# Patient Record
Sex: Male | Born: 1945 | Race: White | Hispanic: No | Marital: Married
Health system: Southern US, Community
[De-identification: ages and names within clinical notes are randomized; demographics above are authoritative.]

---

## 2005-07-19 ENCOUNTER — Ambulatory Visit: Payer: Self-pay | Admitting: Internal Medicine

## 2010-07-08 ENCOUNTER — Ambulatory Visit: Payer: Self-pay | Admitting: Internal Medicine

## 2010-08-22 ENCOUNTER — Ambulatory Visit: Payer: Self-pay | Admitting: Vascular Surgery

## 2011-08-28 ENCOUNTER — Ambulatory Visit (HOSPITAL_COMMUNITY)
Admission: RE | Admit: 2011-08-28 | Discharge: 2011-08-28 | Disposition: A | Discharge status: Home / Self Care | Payer: Medicare Other | Source: Ambulatory Visit | Attending: Cardiology | Admitting: Cardiology

## 2011-08-28 DIAGNOSIS — I70229 Atherosclerosis of native arteries of extremities with rest pain, unspecified extremity: Secondary | ICD-10-CM | POA: Insufficient documentation

## 2011-08-28 LAB — GLUCOSE, CAPILLARY: Glucose-Capillary: 141 mg/dL — ABNORMAL HIGH (ref 70–99)

## 2011-08-30 LAB — POCT ACTIVATED CLOTTING TIME: Activated Clotting Time: 248 seconds

## 2011-09-11 NOTE — Procedures (Signed)
NAME:  Alan Reed, Alan Reed NO.:  [id number]  MEDICAL RECORD NO.:  [id number]  LOCATION:  [facility]SC                         FACILITY:  [facility]  PHYSICIAN:  Pamella Pert, MD DATE OF BIRTH:  12-20-1945  DATE OF PROCEDURE:  08/28/2011 DATE OF DISCHARGE:                   PERIPHERAL VASCULAR INVASIVE PROCEDURE   PROCEDURE PERFORMED: 1. Abdominal aortogram. 2. Abdominal aortogram with bifemoral runoff. 3. Crossover from the right femoral artery to the left femoral artery     and left femoral arteriogram. 4. Placement of a catheter tip into the left popliteal artery and     popliteal arteriogram 5. Right femoral arteriogram with close right femoral artery access     with Perclose.  INDICATIONS:  Mr. Alan Reed is a 65 year old gentleman with history of known peripheral arterial disease, history of iliac artery stenting in the remote past, hyperlipidemia, diabetes who was referred to me for evaluation of critical limb ischemia.  He had absent left lower extremity Dopplers and he had bluish discoloration and rest pain.  He was hence brought back to peripheral angiography suite to evaluate his peripheral anatomy.  Abdominal aortogram:  Abdominal aortogram revealed presence of two renal arteries on either side.  They are widely patent.  The distal abdominal aorta showed mild amount of diffuse atherosclerotic plaque without any significant stenosis.  The aortoiliac bifurcation was widely patent.  The right iliac arteriogram.  The right iliac artery stent is widely patent.  Left iliac artery with femoral runoff.  Left iliac artery with femoral runoff revealed significant plaque burden to the left common iliac artery.  There is also mild-to-moderate amount of calcification.  There was no high-grade stenosis noted in the left common iliac artery.  The stenosis appeared to be at most 40% to 50% with a 20-25 mm pressure gradient.  This was with the crossover catheter as  retrograde access was not obtained.  Left femoral artery with distal runoff revealed left superficial femoral artery to be flush occluded.  It reconstitutes just outside of the Hunter's canal.  Below the left knee, there is one-vessel runoff in the form of peroneal artery.  The anterior tibial and posterior tibial artery are occluded. They reconstitute at the level of the ankle by means of collaterals.  Right femoral artery with distal runoff.  Right distal superficial femoral artery showed 80% stenosis.  It is a short segment stenosis.  Below the right knee, there is three-vessel runoff.  The peroneal artery does teeter off at the level of the ankle.  It is diffusely diseased distally.  However, brisk flow is evident through the posterior tibial and anterior tibial vessels.  INTERVENTION DATA:  I was able to cross the flush occluded right superficial femoral artery.  I was able to cross the flush occlusal of the left superficial femoral artery.  I did place the catheter tip into the popliteal artery and popliteal arteriogram was performed to confirm the reentry into the vessel lumen.  However, I decided to abandon stenting and angioplasty of the superficial femoral artery as at the inflow where it was flush occluded at the origin of the profunda femoral artery I was not sure that I was subintimal or I was in the true lumen. Hence, this  would clearly jeopardize the stenting process if I was not in the true lumen.  Hence, we decided to abandon the procedure as it would make it extremely complex.  Consideration can be given for repeat angiography with eye towards intravascular ultrasound interrogation to confirm the true lumen and then proceed with stenting for continued risk modification and surgical options to be evaluated.  Compared to his examination that was done on August 22, 2011, where his left foot was bluish in color and was cold, this morning his foot is actually  warm, has a pinkish appearance.  Symptomatically, he has also been feeling better.  We were able to Doppler his posterior tibial.  I will discuss these options with Dr. Earnestine Leys and make further recommendations.  A total of 133 mL of contrast was utilized for diagnostic angiography.  TECHNIQUE OF PROCEDURE:  Under sterile precautions using a 5-French right femoral artery access, a pigtail catheter was brought into the abdominal aorta and abdominal aortogram was performed.  After performing abdominal aortogram, we utilized a crossover 5-French catheter to crossover from the right femoral artery to the left femoral artery. Left femoral arteriogram with distal runoff was performed.  TECHNIQUE OF ATTEMPTED INTERVENTION:  I utilized a glide wire and a 4- French end-hole catheter.  With careful probing of the flush occlusion of right superficial femoral artery under fluoroscopic guidance, I was able to enter the lumen and prolapse the wire and I was able to enter into the true lumen into the left popliteal artery.  Then with great amount of difficulty, I tried to advance the 4-French end-hole catheter, but because of inability to perform this, I had to exchange the wire to a woolly wire.  A long extension was utilized for the woolly wire and over this woolly wire, I was able to cross the stenosis with a 4-French end-hole catheter.  The end-hole catheter was placed into the popliteal artery and popliteal arteriogram was performed to confirm the intraluminal nature.  Casual evaluation was then performed utilizing a 5-French sheath that was exchanged from 5-French introducer that was initially attempted in the right femoral artery.  I used a long 5-French sheath and crossed over from right to the left femoral artery and femoral arteriogram was performed.  I was not very convinced that I may be truly in the intraluminal region in the proximal segment at the entry site.  Hence, I decided to pull  the wire back and do conservative therapy only.  Careful pullback was performed across the left iliac artery to see if there is any significant stenosis.  There did not appear to be significant stenosis, although there was a 20-25 mm pressure gradient, I suspected this was due to scraping of the wall by the sheath that was introduced in the antegrade fashion.  Hence I left this lesion alone as it is a very large vessel.  Right femoral arteriogram was performed through the arterial access sheath and the access was closed with a 6-French Perclose with excellent hemostasis.  The patient tolerated the procedure well.  No immediate complications.     Pamella Pert, MD     JRG/MEDQ  D:  08/28/2011  T:  08/28/2011  Job:  [id number]  cc:   Tora Kindred, MD  Electronically Signed by Yates Decamp MD on 09/11/2011 08:42:26 AM

## 2011-09-18 ENCOUNTER — Ambulatory Visit (HOSPITAL_COMMUNITY)
Admission: RE | Admit: 2011-09-18 | Discharge: 2011-09-19 | Disposition: A | Discharge status: Home / Self Care | Payer: Medicare Other | Source: Ambulatory Visit | Attending: Cardiology | Admitting: Cardiology

## 2011-09-18 DIAGNOSIS — F17200 Nicotine dependence, unspecified, uncomplicated: Secondary | ICD-10-CM | POA: Insufficient documentation

## 2011-09-18 DIAGNOSIS — E785 Hyperlipidemia, unspecified: Secondary | ICD-10-CM | POA: Insufficient documentation

## 2011-09-18 DIAGNOSIS — I70219 Atherosclerosis of native arteries of extremities with intermittent claudication, unspecified extremity: Secondary | ICD-10-CM | POA: Insufficient documentation

## 2011-09-18 DIAGNOSIS — E119 Type 2 diabetes mellitus without complications: Secondary | ICD-10-CM | POA: Insufficient documentation

## 2011-09-18 DIAGNOSIS — I70229 Atherosclerosis of native arteries of extremities with rest pain, unspecified extremity: Secondary | ICD-10-CM | POA: Insufficient documentation

## 2011-09-18 LAB — GLUCOSE, CAPILLARY
Glucose-Capillary: 164 mg/dL — ABNORMAL HIGH (ref 70–99)
Glucose-Capillary: 340 mg/dL — ABNORMAL HIGH (ref 70–99)

## 2011-09-18 LAB — CBC
Hemoglobin: 17 g/dL (ref 13.0–17.0)
MCH: 32.8 pg (ref 26.0–34.0)
MCHC: 36.5 g/dL — ABNORMAL HIGH (ref 30.0–36.0)
MCV: 90 fL (ref 78.0–100.0)

## 2011-09-19 LAB — CBC
HCT: 46.8 % (ref 39.0–52.0)
Hemoglobin: 17 g/dL (ref 13.0–17.0)
MCV: 88.8 fL (ref 78.0–100.0)
RDW: 13.2 % (ref 11.5–15.5)
WBC: 16.4 10*3/uL — ABNORMAL HIGH (ref 4.0–10.5)

## 2011-09-19 LAB — BASIC METABOLIC PANEL
BUN: 17 mg/dL (ref 6–23)
CO2: 25 mEq/L (ref 19–32)
Chloride: 98 mEq/L (ref 96–112)
Creatinine, Ser: 0.76 mg/dL (ref 0.50–1.35)
GFR calc Af Amer: >90 mL/min (ref >90)
Glucose, Bld: 162 mg/dL — ABNORMAL HIGH (ref 70–99)
Potassium: 3.9 mEq/L (ref 3.5–5.1)

## 2011-09-29 NOTE — Discharge Summary (Signed)
NAMELARNELL, [name] NO.:  [id number]  MEDICAL RECORD NO.:  [id number]  LOCATION:  2504                         FACILITY:  [facility]  PHYSICIAN:  Pamella Pert, MD DATE OF BIRTH:  Nov 30, 1946  DATE OF ADMISSION:  09/18/2011 DATE OF DISCHARGE:  09/19/2011                              DISCHARGE SUMMARY   DISCHARGE DIAGNOSES: 1. Peripheral arterial disease with lifestyle limiting claudication     and ulceration and limb threatening ischemia of the left foot. 2. Successful percutaneous transluminal angioplasty and stenting of     the left superficial femoral artery with reduction of stenosis from     100% to 0% with implantation of 3 overlapping self expanding stents     measuring distal to proximal 6.0 x 150 mm EverFlex, 6.0 x 120 mm     EverFlex, 6.0 x 30 mm Absolute Pro. 3. Diabetes mellitus type 2, controlled. 4. Hyperlipidemia. 5. Tobacco use disorder.  RECOMMENDATIONS: 1. The patient will be discharged home today.  He has been started on     aggressive risk modification.  He is also attempting to quit     tobacco use which I have encouraged.  He seems better with the use     of nicotine patch which I have encouraged him. 2. He will need further followup of peripheral arterial disease, and     also probable referral [facility] if the left foot ulcer     does not heal. 3. He will continue with Plavix for at least a period of a year or     probably long time given his significant cardiovascular risk.  He     will also continue with Lipitor, Coreg, and aspirin indefinitely.     He will follow up with Dr. Luciano Cutter  for further management and     evaluation.  BRIEF HISTORY AND HOSPITAL COURSE:  Alan Reed is a 65 year old gentleman with severe peripheral arterial disease.  He has undergone peripheral angiography on August 28, 2011, and this had revealed a long segment occlusion of the left superficial femoral artery.  There was one-vessel runoff in  the form of peroneal artery below the left knee.  He has severe ischemic foot with rest pain and also had developed a small superficial ulceration in his foot.  Hence he was brought back for peripheral angiography and possible angioplasty of the same.  He underwent successful angioplasty of a long segment occlusion of the left superficial femoral artery.  The following day, he was felt to be safe and stable for discharge.  HOSPITAL LABORATORY DATA:  A BMP which was within normal limits with a creatinine of 0.76 and BUN of 17 and potassium 3.9.  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  White count was mildly elevated at 16.4 with a platelet count of 234,000.  His vitals remained stable the next day with a temperature of 97.8, pulse of 81 beats per minute, respirations 14, blood pressure 124/67 mmHg.  There was no evidence of any hematoma in his right groin.  His left foot was warm with a very bounding left popliteal  pulse and faint dorsalis pedis pulse.  Hence, he was felt safe to be discharged.  RECOMMENDATIONS:  The patient will follow up with Dr. Tora Kindred for further management and evaluation and he will see me back on a p.r.n. basis.     Pamella Pert, MD     JRG/MEDQ  D:  09/19/2011  T:  09/19/2011  Job:  [id number]  Electronically Signed by Yates Decamp MD on 09/29/2011 05:48:04 PM

## 2011-09-29 NOTE — Procedures (Signed)
NAME:  Alan Reed, Alan Reed NO.:  [id number]  MEDICAL RECORD NO.:  [id number]  LOCATION:  2504                         FACILITY:  [facility]  PHYSICIAN:  Pamella Pert, MD DATE OF BIRTH:  07/11/1946  DATE OF PROCEDURE:  09/18/2011 DATE OF DISCHARGE:                   PERIPHERAL VASCULAR INVASIVE PROCEDURE   PROCEDURE PERFORMED: 1. Left femoral arteriogram with distal runoff. 2. PTA and stenting of the 100% occluded left distal, mid, and     proximal SFA with implantation of overlapping 6.0 x 150 mm Arrow-     Flex in the distal end, 6.0 x 120 mm Arrow-Flex in the proximal     end, and a 6.0 x 30 mm Absolute Pro into the ostium of the left     superficial femoral artery.  INDICATION:  Mr. Alan Reed is a 65 year old gentleman who has history of smoking, hypertension, and hyperlipidemia who has been complaining of lifestyle limiting claudication and rest pain.  He had undergone peripheral angiography 2 weeks ago and had found a flush occlusion of the left superficial femoral artery with a very small opening into the left proximal superficial femoral artery.  I was able to cross this stenosis with the help of a Glidewire.  However, he was scheduled for a elective angioplasty given the complexity of this vessel anatomy and the proximity to profunda femoral artery.  After explaining risks, benefits, and alternatives to this procedure, he was brought electively to the peripheral angiography suite to evaluate his peripheral anatomy and with an eye towards revascularization.  Left femoral arteriogram with distal runoff.  Left femoral arteriogram with distal runoff revealed the left superficial femoral artery to be flush occluded at its origin.  It reconstitutes just outside of the Hunter's canal.  Below the left knee, there was 1 vessel in the form of peroneal artery.  Anterior posterior tibial artery are occluded in the mid segment and reconstitute below the  ankle.  INTERVENTION DATA:  Successful PTA and stenting of the left superficial femoral artery with implantation of 3 overlapping self-expanding stents as dictated above.  The stenosis was reduced from 100% to 0% with brisk flow evident into the superficial femoral artery and into the popliteal and peroneal artery.  Abdominal aortogram.  Abdominal aortogram revealed the left iliac artery again showing diffuse luminal irregularity and mild calcification. Careful pullback across the left common iliac artery stenosis revealed a 10-mm pressure gradient.  Hence the lesions were left alone.  Right femoral arteriography was performed through the arterial access sheath and access was closed with Perclose with excellent hemostasis.  A total of 135 mL of contrast was utilized for diagnostic and intervention procedure.  TECHNIQUE OF THE PROCEDURE:  Under sterile precautions using a 7-French right femoral arterial access, a 7-French Ansel sheath was advanced into the left iliac artery with the help of a 5-French crossover sheath. Then using a Glidewire and a 5-French guide tip catheter, I was able to cross the occluded left superficial femoral artery and the tip of the wire was carefully positioned into the popliteal artery.  Then I used the same 5-French end-hole catheter to place the catheter into the popliteal artery and exchange to a long Versacore wire.  Rest  of the intervention was performed using the Versacore wire.  Using heparin for anticoagulation maintaining ACT greater than 200, I initially used a 5.0 x 100 mm Fox Cross balloon and balloon angioplasty was performed around 6 atmospheric pressure x3 for a minute each followed by serial stenting starting proximal.  There was a ostial lesion missed by the second stent implantation.  Hence the third stent had to be deployed.  Under fluoroscopy guidance and angiographic guidance, I deployed absolute Pro stent.  There was still a small  amount of stent that hang into the profunda femoral artery but there was no jeopardy of any flow.  Having performed this, we repeated the balloon angioplasty of the stent with 5.0 x 100 mm Fox Cross balloon and balloon inflations were performed throughout the stented segment at 6 atmospheric pressure.  During the procedure, intra-arterial nitroglycerin was also administered and angiography was performed.  The crossover sheath was then pulled back into the right femoral artery and angiography was performed.  Careful pullback was also performed to evaluate for pressure gradient across the stenosis in the left common iliac artery again.  This was also confirmed previously.  Right femoral arteriography was performed through the arterial access sheath and access was closed with Perclose with excellent hemostasis. The patient tolerated the procedure well.  No immediate complications.     Pamella Pert, MD     JRG/MEDQ  D:  09/18/2011  T:  09/18/2011  Job:  [id number]  cc:   Tora Kindred  Electronically Signed by Yates Decamp MD on 09/29/2011 05:48:10 PM

## 2011-10-12 ENCOUNTER — Encounter: Payer: Self-pay | Admitting: Nurse Practitioner

## 2011-10-12 ENCOUNTER — Encounter: Payer: Self-pay | Admitting: Cardiothoracic Surgery

## 2011-11-04 ENCOUNTER — Encounter: Payer: Self-pay | Admitting: Nurse Practitioner

## 2011-11-04 ENCOUNTER — Encounter: Payer: Self-pay | Admitting: Cardiothoracic Surgery

## 2011-12-05 ENCOUNTER — Encounter: Payer: Self-pay | Admitting: Nurse Practitioner

## 2011-12-05 ENCOUNTER — Encounter: Payer: Self-pay | Admitting: Cardiothoracic Surgery

## 2012-01-05 ENCOUNTER — Encounter: Payer: Self-pay | Admitting: Cardiothoracic Surgery

## 2012-01-05 ENCOUNTER — Encounter: Payer: Self-pay | Admitting: Nurse Practitioner

## 2012-02-02 ENCOUNTER — Encounter: Payer: Self-pay | Admitting: Nurse Practitioner

## 2012-02-02 ENCOUNTER — Encounter: Payer: Self-pay | Admitting: Cardiothoracic Surgery

## 2012-03-04 ENCOUNTER — Encounter: Payer: Self-pay | Admitting: Nurse Practitioner

## 2012-03-04 ENCOUNTER — Encounter: Payer: Self-pay | Admitting: Cardiothoracic Surgery

## 2013-01-13 ENCOUNTER — Ambulatory Visit: Payer: Self-pay | Admitting: Vascular Surgery

## 2013-01-13 LAB — BASIC METABOLIC PANEL
BUN: 14 mg/dL (ref 7–18)
Calcium, Total: 9 mg/dL (ref 8.5–10.1)
Chloride: 102 mmol/L (ref 98–107)
Creatinine: 0.84 mg/dL (ref 0.60–1.30)
EGFR (African American): >60
Osmolality: 276 (ref 275–301)
Potassium: 4.5 mmol/L (ref 3.5–5.1)
Sodium: 135 mmol/L — ABNORMAL LOW (ref 136–145)

## 2013-01-13 LAB — HEMOGLOBIN: HGB: 16.8 g/dL (ref 13.0–18.0)

## 2013-01-14 LAB — CBC WITH DIFFERENTIAL/PLATELET
Basophil %: 0.7 %
Lymphocyte %: 15.3 %
MCHC: 34.3 g/dL (ref 32.0–36.0)
MCV: 93 fL (ref 80–100)
Monocyte %: 10.2 %
Neutrophil #: 7.9 10*3/uL — ABNORMAL HIGH (ref 1.4–6.5)
RDW: 14.1 % (ref 11.5–14.5)
WBC: 10.8 10*3/uL — ABNORMAL HIGH (ref 3.8–10.6)

## 2013-01-14 LAB — BASIC METABOLIC PANEL
Anion Gap: 7 (ref 7–16)
Calcium, Total: 8.4 mg/dL — ABNORMAL LOW (ref 8.5–10.1)
Chloride: 104 mmol/L (ref 98–107)
Creatinine: 0.91 mg/dL (ref 0.60–1.30)
Glucose: 121 mg/dL — ABNORMAL HIGH (ref 65–99)
Potassium: 4.2 mmol/L (ref 3.5–5.1)

## 2013-01-23 ENCOUNTER — Ambulatory Visit: Payer: Self-pay | Admitting: Vascular Surgery

## 2013-01-23 LAB — BASIC METABOLIC PANEL
Anion Gap: 8 (ref 7–16)
Calcium, Total: 8.7 mg/dL (ref 8.5–10.1)
Chloride: 99 mmol/L (ref 98–107)
Glucose: 188 mg/dL — ABNORMAL HIGH (ref 65–99)
Osmolality: 269 (ref 275–301)
Sodium: 131 mmol/L — ABNORMAL LOW (ref 136–145)

## 2013-01-23 LAB — CBC
HCT: 45.3 % (ref 40.0–52.0)
MCV: 93 fL (ref 80–100)
Platelet: 390 10*3/uL (ref 150–440)
WBC: 16.4 10*3/uL — ABNORMAL HIGH (ref 3.8–10.6)

## 2013-01-27 ENCOUNTER — Inpatient Hospital Stay: Payer: Self-pay | Admitting: Vascular Surgery

## 2013-01-28 LAB — BASIC METABOLIC PANEL WITH GFR
Anion Gap: 9
BUN: 13 mg/dL
Calcium, Total: 8 mg/dL — ABNORMAL LOW
Chloride: 103 mmol/L
Co2: 23 mmol/L
Creatinine: 0.74 mg/dL
EGFR (African American): >60
EGFR (Non-African Amer.): >60
Glucose: 141 mg/dL — ABNORMAL HIGH
Osmolality: 273
Potassium: 3.6 mmol/L
Sodium: 135 mmol/L — ABNORMAL LOW

## 2013-01-28 LAB — CBC WITH DIFFERENTIAL/PLATELET
Basophil #: 0.1 x10 3/mm 3
Basophil %: 0.7 %
Eosinophil #: 0 x10 3/mm 3
Eosinophil %: 0.2 %
HCT: 37.2 % — ABNORMAL LOW
HGB: 12.2 g/dL — ABNORMAL LOW
Lymphocyte %: 10.4 %
Lymphs Abs: 1.3 x10 3/mm 3
MCH: 30.6 pg
MCHC: 32.9 g/dL
MCV: 93 fL
Monocyte #: 0.7 "x10 3/mm "
Monocyte %: 5.8 %
Neutrophil #: 10.1 x10 3/mm 3 — ABNORMAL HIGH
Neutrophil %: 82.9 %
Platelet: 295 x10 3/mm 3
RBC: 4 x10 6/mm 3 — ABNORMAL LOW
RDW: 13.9 %
WBC: 12.2 x10 3/mm 3 — ABNORMAL HIGH

## 2013-01-29 LAB — CBC WITH DIFFERENTIAL/PLATELET
Basophil #: 0 10*3/uL (ref 0.0–0.1)
Eosinophil #: 0.1 10*3/uL (ref 0.0–0.7)
Eosinophil %: 1 %
Lymphocyte #: 1.4 10*3/uL (ref 1.0–3.6)
Lymphocyte %: 14 %
MCHC: 33 g/dL (ref 32.0–36.0)
Neutrophil #: 7.8 10*3/uL — ABNORMAL HIGH (ref 1.4–6.5)
Neutrophil %: 78 %
Platelet: 289 10*3/uL (ref 150–440)
RDW: 13.8 % (ref 11.5–14.5)
WBC: 10.1 10*3/uL (ref 3.8–10.6)

## 2013-01-29 LAB — PATHOLOGY REPORT

## 2013-01-29 LAB — BASIC METABOLIC PANEL
Anion Gap: 8 (ref 7–16)
BUN: 10 mg/dL (ref 7–18)
Calcium, Total: 7.9 mg/dL — ABNORMAL LOW (ref 8.5–10.1)
Creatinine: 0.82 mg/dL (ref 0.60–1.30)
EGFR (Non-African Amer.): >60
Osmolality: 273 (ref 275–301)
Potassium: 4 mmol/L (ref 3.5–5.1)

## 2013-01-30 LAB — CBC WITH DIFFERENTIAL/PLATELET
Basophil %: 0.6 %
Eosinophil #: 0.1 10*3/uL (ref 0.0–0.7)
Eosinophil %: 1.3 %
HGB: 12.8 g/dL — ABNORMAL LOW (ref 13.0–18.0)
MCHC: 33.3 g/dL (ref 32.0–36.0)
RBC: 4.15 10*6/uL — ABNORMAL LOW (ref 4.40–5.90)
RDW: 13.6 % (ref 11.5–14.5)
WBC: 8.4 10*3/uL (ref 3.8–10.6)

## 2013-01-31 LAB — CBC WITH DIFFERENTIAL/PLATELET
Basophil #: 0 10*3/uL (ref 0.0–0.1)
Basophil %: 0.4 %
Eosinophil %: 1.1 %
MCHC: 34.3 g/dL (ref 32.0–36.0)
RBC: 3.98 10*6/uL — ABNORMAL LOW (ref 4.40–5.90)
RDW: 13.6 % (ref 11.5–14.5)
WBC: 10.9 10*3/uL — ABNORMAL HIGH (ref 3.8–10.6)

## 2013-01-31 LAB — BASIC METABOLIC PANEL
Anion Gap: 4 — ABNORMAL LOW (ref 7–16)
Calcium, Total: 7.6 mg/dL — ABNORMAL LOW (ref 8.5–10.1)
Chloride: 103 mmol/L (ref 98–107)
Creatinine: 0.69 mg/dL (ref 0.60–1.30)
EGFR (Non-African Amer.): >60
Sodium: 135 mmol/L — ABNORMAL LOW (ref 136–145)

## 2013-02-03 LAB — PATHOLOGY REPORT

## 2013-02-04 LAB — CREATININE, SERUM
Creatinine: 0.67 mg/dL (ref 0.60–1.30)
EGFR (African American): >60
EGFR (Non-African Amer.): >60

## 2013-02-04 LAB — HEMOGLOBIN: HGB: 12.7 g/dL — ABNORMAL LOW (ref 13.0–18.0)

## 2013-02-17 ENCOUNTER — Ambulatory Visit: Payer: Self-pay | Admitting: Vascular Surgery

## 2013-02-17 LAB — BASIC METABOLIC PANEL
Anion Gap: 7 (ref 7–16)
BUN: 17 mg/dL (ref 7–18)
Chloride: 101 mmol/L (ref 98–107)
Co2: 25 mmol/L (ref 21–32)
Creatinine: 0.73 mg/dL (ref 0.60–1.30)
EGFR (Non-African Amer.): >60
Glucose: 154 mg/dL — ABNORMAL HIGH (ref 65–99)
Potassium: 5.1 mmol/L (ref 3.5–5.1)
Sodium: 133 mmol/L — ABNORMAL LOW (ref 136–145)

## 2013-02-25 ENCOUNTER — Inpatient Hospital Stay: Payer: Self-pay | Admitting: Internal Medicine

## 2013-02-25 LAB — CBC WITH DIFFERENTIAL/PLATELET
Basophil #: 0 10*3/uL (ref 0.0–0.1)
Basophil %: 0.2 %
Eosinophil %: 0.1 %
HCT: 44.2 % (ref 40.0–52.0)
HGB: 14.8 g/dL (ref 13.0–18.0)
Lymphocyte %: 5.5 %
MCH: 31.2 pg (ref 26.0–34.0)
MCV: 94 fL (ref 80–100)
Monocyte %: 5.4 %
Platelet: 345 10*3/uL (ref 150–440)
RBC: 4.73 10*6/uL (ref 4.40–5.90)
RDW: 14.3 % (ref 11.5–14.5)
WBC: 16.1 10*3/uL — ABNORMAL HIGH (ref 3.8–10.6)

## 2013-02-25 LAB — URINALYSIS, COMPLETE
Ketone: NEGATIVE
Ph: 9 (ref 4.5–8.0)
Protein: >=500
Specific Gravity: 1.019 (ref 1.003–1.030)
Squamous Epithelial: 1
WBC UR: 82 /HPF (ref 0–5)

## 2013-02-25 LAB — BASIC METABOLIC PANEL
Anion Gap: 8 (ref 7–16)
Calcium, Total: 8.9 mg/dL (ref 8.5–10.1)
EGFR (African American): >60
Osmolality: 275 (ref 275–301)
Potassium: 4.3 mmol/L (ref 3.5–5.1)

## 2013-02-26 LAB — BASIC METABOLIC PANEL
Anion Gap: 7 (ref 7–16)
BUN: 14 mg/dL (ref 7–18)
Calcium, Total: 8.2 mg/dL — ABNORMAL LOW (ref 8.5–10.1)
Chloride: 103 mmol/L (ref 98–107)
Co2: 26 mmol/L (ref 21–32)
Creatinine: 0.91 mg/dL (ref 0.60–1.30)
EGFR (African American): >60
EGFR (Non-African Amer.): >60
Glucose: 96 mg/dL (ref 65–99)
Osmolality: 272 (ref 275–301)
Potassium: 4.2 mmol/L (ref 3.5–5.1)

## 2013-02-26 LAB — MAGNESIUM: Magnesium: 1.7 mg/dL — ABNORMAL LOW

## 2013-02-26 LAB — CBC WITH DIFFERENTIAL/PLATELET
Basophil #: 0.1 10*3/uL (ref 0.0–0.1)
Basophil %: 0.7 %
Eosinophil #: 0.1 10*3/uL (ref 0.0–0.7)
Eosinophil %: 1.6 %
HCT: 36.8 % — ABNORMAL LOW (ref 40.0–52.0)
HGB: 12.7 g/dL — ABNORMAL LOW (ref 13.0–18.0)
Lymphocyte #: 1.5 10*3/uL (ref 1.0–3.6)
Lymphocyte %: 16 %
MCH: 31.9 pg (ref 26.0–34.0)
MCHC: 34.5 g/dL (ref 32.0–36.0)
MCV: 92 fL (ref 80–100)
Monocyte #: 0.6 x10 3/mm (ref 0.2–1.0)
Neutrophil #: 7.2 10*3/uL — ABNORMAL HIGH (ref 1.4–6.5)
RBC: 3.98 10*6/uL — ABNORMAL LOW (ref 4.40–5.90)
RDW: 14.5 % (ref 11.5–14.5)
WBC: 9.4 10*3/uL (ref 3.8–10.6)

## 2013-02-26 LAB — HEMOGLOBIN A1C: Hemoglobin A1C: 6.7 % — ABNORMAL HIGH (ref 4.2–6.3)

## 2013-03-01 LAB — URINE CULTURE

## 2013-03-02 LAB — CULTURE, BLOOD (SINGLE)

## 2013-03-10 ENCOUNTER — Emergency Department: Payer: Self-pay | Admitting: Emergency Medicine

## 2013-03-10 LAB — URINALYSIS, COMPLETE
Bilirubin,UR: NEGATIVE
Ketone: NEGATIVE
Nitrite: NEGATIVE
Squamous Epithelial: NONE SEEN

## 2014-10-04 ENCOUNTER — Ambulatory Visit: Payer: Self-pay | Admitting: Internal Medicine

## 2014-10-05 ENCOUNTER — Inpatient Hospital Stay: Payer: Self-pay | Admitting: Internal Medicine

## 2014-10-05 LAB — COMPREHENSIVE METABOLIC PANEL
ALT: 100 U/L — AB
ANION GAP: 11 (ref 7–16)
Albumin: 2.7 g/dL — ABNORMAL LOW (ref 3.4–5.0)
Alkaline Phosphatase: 121 U/L — ABNORMAL HIGH
BUN: 113 mg/dL — ABNORMAL HIGH (ref 7–18)
Bilirubin,Total: 0.8 mg/dL (ref 0.2–1.0)
CALCIUM: 7.4 mg/dL — AB (ref 8.5–10.1)
Chloride: 107 mmol/L (ref 98–107)
Co2: 20 mmol/L — ABNORMAL LOW (ref 21–32)
Creatinine: 2.27 mg/dL — ABNORMAL HIGH (ref 0.60–1.30)
EGFR (African American): 37 — ABNORMAL LOW
EGFR (Non-African Amer.): 30 — ABNORMAL LOW
GLUCOSE: 216 mg/dL — AB (ref 65–99)
Osmolality: 318 (ref 275–301)
Potassium: 4.3 mmol/L (ref 3.5–5.1)
SGOT(AST): 35 U/L (ref 15–37)
Sodium: 138 mmol/L (ref 136–145)
TOTAL PROTEIN: 6.9 g/dL (ref 6.4–8.2)

## 2014-10-05 LAB — PHOSPHORUS: PHOSPHORUS: 5.3 mg/dL — AB (ref 2.5–4.9)

## 2014-10-05 LAB — PROTIME-INR
INR: 1.6
PROTHROMBIN TIME: 18.9 s — AB (ref 11.5–14.7)

## 2014-10-05 LAB — CBC
HCT: 12.7 % — AB (ref 40.0–52.0)
HGB: 4.2 g/dL — AB (ref 13.0–18.0)
MCH: 41.6 pg — ABNORMAL HIGH (ref 26.0–34.0)
MCHC: 33.2 g/dL (ref 32.0–36.0)
MCV: 125 fL — ABNORMAL HIGH (ref 80–100)
Platelet: 64 10*3/uL — ABNORMAL LOW (ref 150–440)
RBC: 1.01 10*6/uL — ABNORMAL LOW (ref 4.40–5.90)
RDW: 17.3 % — ABNORMAL HIGH (ref 11.5–14.5)
WBC: 1 10*3/uL — CL (ref 3.8–10.6)

## 2014-10-05 LAB — RETICULOCYTES
ABSOLUTE RETIC COUNT: 0.015 10*6/uL — AB (ref 0.019–0.186)
RETICULOCYTE: 1.4 % (ref 0.4–3.1)

## 2014-10-05 LAB — IRON AND TIBC
IRON: 207 ug/dL — AB (ref 65–175)
Iron Bind.Cap.(Total): 220 ug/dL — ABNORMAL LOW (ref 250–450)
Iron Saturation: 94 %
Unbound Iron-Bind.Cap.: 13 ug/dL

## 2014-10-05 LAB — TROPONIN I: Troponin-I: 0.23 ng/mL — ABNORMAL HIGH

## 2014-10-05 LAB — MAGNESIUM: Magnesium: 2.6 mg/dL — ABNORMAL HIGH

## 2014-10-06 LAB — HEMOGLOBIN
HGB: 7 g/dL — AB (ref 13.0–18.0)
HGB: 6.8 g/dL — AB (ref 13.0–18.0)

## 2014-10-06 LAB — URINALYSIS, COMPLETE
Bilirubin,UR: NEGATIVE
Blood: NEGATIVE
GLUCOSE, UR: NEGATIVE mg/dL (ref 0–75)
Hyaline Cast: 3
Ketone: NEGATIVE
Nitrite: NEGATIVE
PH: 6 (ref 4.5–8.0)
Protein: NEGATIVE
RBC,UR: 3 /HPF (ref 0–5)
Specific Gravity: 1.015 (ref 1.003–1.030)
WBC UR: 16 /HPF (ref 0–5)

## 2014-10-06 LAB — CBC WITH DIFFERENTIAL/PLATELET
BASOS ABS: 0 10*3/uL (ref 0.0–0.1)
Basophil %: 0 %
Eosinophil #: 0 10*3/uL (ref 0.0–0.7)
Eosinophil %: 2.5 %
HCT: 20.6 % — AB (ref 40.0–52.0)
HGB: 6.9 g/dL — ABNORMAL LOW (ref 13.0–18.0)
LYMPHS ABS: 0.4 10*3/uL — AB (ref 1.0–3.6)
LYMPHS PCT: 49 %
MCH: 34.9 pg — ABNORMAL HIGH (ref 26.0–34.0)
MCHC: 33.6 g/dL (ref 32.0–36.0)
MCV: 104 fL — ABNORMAL HIGH (ref 80–100)
MONOS PCT: 1.7 %
Monocyte #: 0 x10 3/mm — ABNORMAL LOW (ref 0.2–1.0)
NEUTROS ABS: 0.4 10*3/uL — AB (ref 1.4–6.5)
Neutrophil %: 46.8 %
Platelet: 41 10*3/uL — ABNORMAL LOW (ref 150–440)
RBC: 1.99 10*6/uL — AB (ref 4.40–5.90)
RDW: 28.2 % — ABNORMAL HIGH (ref 11.5–14.5)
WBC: 0.8 10*3/uL — CL (ref 3.8–10.6)

## 2014-10-06 LAB — BASIC METABOLIC PANEL
Anion Gap: 10 (ref 7–16)
BUN: 96 mg/dL — ABNORMAL HIGH (ref 7–18)
CALCIUM: 7.4 mg/dL — AB (ref 8.5–10.1)
Chloride: 113 mmol/L — ABNORMAL HIGH (ref 98–107)
Co2: 18 mmol/L — ABNORMAL LOW (ref 21–32)
Creatinine: 1.7 mg/dL — ABNORMAL HIGH (ref 0.60–1.30)
EGFR (African American): 52 — ABNORMAL LOW
EGFR (Non-African Amer.): 43 — ABNORMAL LOW
GLUCOSE: 218 mg/dL — AB (ref 65–99)
OSMOLALITY: 318 (ref 275–301)
Potassium: 4.1 mmol/L (ref 3.5–5.1)
SODIUM: 141 mmol/L (ref 136–145)

## 2014-10-06 LAB — PROTIME-INR
INR: 1.7
Prothrombin Time: 19.2 secs — ABNORMAL HIGH (ref 11.5–14.7)

## 2014-10-06 LAB — TROPONIN I
TROPONIN-I: 0.23 ng/mL — AB
Troponin-I: 0.28 ng/mL — ABNORMAL HIGH

## 2014-10-06 LAB — LACTATE DEHYDROGENASE: LDH: 168 U/L (ref 85–241)

## 2014-10-06 LAB — FIBRINOGEN: Fibrinogen: 260 mg/dL (ref 210–470)

## 2014-10-06 LAB — CK TOTAL AND CKMB (NOT AT ARMC)
CK, TOTAL: 28 U/L — AB
CK, TOTAL: 34 U/L — AB
CK-MB: 2.5 ng/mL (ref 0.5–3.6)
CK-MB: 2.6 ng/mL (ref 0.5–3.6)

## 2014-10-06 LAB — APTT: Activated PTT: 41.7 secs — ABNORMAL HIGH (ref 23.6–35.9)

## 2014-10-06 LAB — FOLATE: Folic Acid: 8.8 ng/mL (ref 3.1–100.0)

## 2014-10-06 LAB — FIBRIN DEGRADATION PROD.(ARMC ONLY): Fibrin Degradation Prod.: <10 ug/ml (ref 2.1–7.7)

## 2014-10-07 LAB — CBC WITH DIFFERENTIAL/PLATELET
BANDS NEUTROPHIL: 1 %
HCT: 20.7 % — ABNORMAL LOW (ref 40.0–52.0)
HGB: 7 g/dL — AB (ref 13.0–18.0)
Lymphocytes: 61 %
MCH: 34.5 pg — ABNORMAL HIGH (ref 26.0–34.0)
MCHC: 34 g/dL (ref 32.0–36.0)
MCV: 102 fL — ABNORMAL HIGH (ref 80–100)
MONOS PCT: 3 %
Platelet: 31 10*3/uL — ABNORMAL LOW (ref 150–440)
RBC: 2.04 10*6/uL — AB (ref 4.40–5.90)
RDW: 27.7 % — ABNORMAL HIGH (ref 11.5–14.5)
Segmented Neutrophils: 35 %
WBC: 0.9 10*3/uL — CL (ref 3.8–10.6)

## 2014-10-07 LAB — GASTROCCULT (ARMC)
Occult Blood, Gastric: NEGATIVE
PH, GASTRIC: 4 (ref 1–3)

## 2014-10-07 LAB — COMPREHENSIVE METABOLIC PANEL
ALBUMIN: 2.1 g/dL — AB (ref 3.4–5.0)
AST: 21 U/L (ref 15–37)
Alkaline Phosphatase: 106 U/L
Anion Gap: 7 (ref 7–16)
BUN: 51 mg/dL — ABNORMAL HIGH (ref 7–18)
Bilirubin,Total: 1.2 mg/dL — ABNORMAL HIGH (ref 0.2–1.0)
CHLORIDE: 117 mmol/L — AB (ref 98–107)
CO2: 21 mmol/L (ref 21–32)
CREATININE: 1.17 mg/dL (ref 0.60–1.30)
Calcium, Total: 7.1 mg/dL — ABNORMAL LOW (ref 8.5–10.1)
EGFR (African American): >60
EGFR (Non-African Amer.): >60
GLUCOSE: 144 mg/dL — AB (ref 65–99)
OSMOLALITY: 305 (ref 275–301)
POTASSIUM: 4.1 mmol/L (ref 3.5–5.1)
SGPT (ALT): 51 U/L
Sodium: 145 mmol/L (ref 136–145)
Total Protein: 5.5 g/dL — ABNORMAL LOW (ref 6.4–8.2)

## 2014-10-07 LAB — HEMOGLOBIN: HGB: 7.4 g/dL — AB (ref 13.0–18.0)

## 2014-10-07 LAB — RETICULOCYTES
Absolute Retic Count: 0.0292 10*6/uL (ref 0.019–0.186)
RETICULOCYTE: 1.42 % (ref 0.4–3.1)

## 2014-10-07 LAB — PROT IMMUNOELECTROPHORES(ARMC)

## 2014-10-07 LAB — URINE CULTURE

## 2014-10-08 DIAGNOSIS — I350 Nonrheumatic aortic (valve) stenosis: Secondary | ICD-10-CM

## 2014-10-08 LAB — CBC WITH DIFFERENTIAL/PLATELET
BASOS ABS: 0 10*3/uL (ref 0.0–0.1)
Basophil %: 0 %
Eosinophil #: 0 10*3/uL (ref 0.0–0.7)
Eosinophil %: 5 %
HCT: 19.5 % — AB (ref 40.0–52.0)
HGB: 6.6 g/dL — AB (ref 13.0–18.0)
Lymphocyte #: 0.7 10*3/uL — ABNORMAL LOW (ref 1.0–3.6)
Lymphocyte %: 70.9 %
MCH: 34.7 pg — ABNORMAL HIGH (ref 26.0–34.0)
MCHC: 34.1 g/dL (ref 32.0–36.0)
MCV: 102 fL — AB (ref 80–100)
MONO ABS: 0 x10 3/mm — AB (ref 0.2–1.0)
Monocyte %: 4.1 %
Neutrophil #: 0.2 10*3/uL — ABNORMAL LOW (ref 1.4–6.5)
Neutrophil %: 20 %
PLATELETS: 28 10*3/uL — AB (ref 150–440)
RBC: 1.91 10*6/uL — ABNORMAL LOW (ref 4.40–5.90)
RDW: 27 % — ABNORMAL HIGH (ref 11.5–14.5)
WBC: 0.9 10*3/uL — CL (ref 3.8–10.6)

## 2014-10-08 LAB — BASIC METABOLIC PANEL
Anion Gap: 5 — ABNORMAL LOW (ref 7–16)
BUN: 37 mg/dL — ABNORMAL HIGH (ref 7–18)
CALCIUM: 7.1 mg/dL — AB (ref 8.5–10.1)
CO2: 23 mmol/L (ref 21–32)
Chloride: 118 mmol/L — ABNORMAL HIGH (ref 98–107)
GLUCOSE: 163 mg/dL — AB (ref 65–99)
Osmolality: 303 (ref 275–301)
Potassium: 4.5 mmol/L (ref 3.5–5.1)
SODIUM: 146 mmol/L — AB (ref 136–145)

## 2014-10-08 LAB — CREATININE, SERUM
Creatinine: 1.15 mg/dL (ref 0.60–1.30)
EGFR (Non-African Amer.): >60

## 2014-10-08 LAB — VANCOMYCIN, TROUGH: Vancomycin, Trough: 11 ug/mL (ref 10–20)

## 2014-10-09 LAB — CBC WITH DIFFERENTIAL/PLATELET
Basophil #: 0 10*3/uL (ref 0.0–0.1)
Basophil %: 0.3 %
EOS PCT: 1.2 %
Eosinophil #: 0 10*3/uL (ref 0.0–0.7)
HCT: 21.2 % — ABNORMAL LOW (ref 40.0–52.0)
HGB: 7.1 g/dL — ABNORMAL LOW (ref 13.0–18.0)
LYMPHS ABS: 0.5 10*3/uL — AB (ref 1.0–3.6)
LYMPHS PCT: 50.1 %
MCH: 33.5 pg (ref 26.0–34.0)
MCHC: 33.6 g/dL (ref 32.0–36.0)
MCV: 100 fL (ref 80–100)
MONOS PCT: 2 %
Monocyte #: 0 x10 3/mm — ABNORMAL LOW (ref 0.2–1.0)
NEUTROS ABS: 0.5 10*3/uL — AB (ref 1.4–6.5)
Neutrophil %: 46.4 %
Platelet: 28 10*3/uL — CL (ref 150–440)
RBC: 2.12 10*6/uL — ABNORMAL LOW (ref 4.40–5.90)
RDW: 24.4 % — ABNORMAL HIGH (ref 11.5–14.5)
WBC: 1 10*3/uL — AB (ref 3.8–10.6)

## 2014-10-09 LAB — BASIC METABOLIC PANEL
Anion Gap: 4 — ABNORMAL LOW (ref 7–16)
BUN: 26 mg/dL — ABNORMAL HIGH (ref 7–18)
CO2: 26 mmol/L (ref 21–32)
CREATININE: 0.92 mg/dL (ref 0.60–1.30)
Calcium, Total: 7 mg/dL — CL (ref 8.5–10.1)
Chloride: 113 mmol/L — ABNORMAL HIGH (ref 98–107)
EGFR (Non-African Amer.): >60
GLUCOSE: 150 mg/dL — AB (ref 65–99)
Osmolality: 293 (ref 275–301)
Potassium: 4.2 mmol/L (ref 3.5–5.1)
Sodium: 143 mmol/L (ref 136–145)

## 2014-10-10 LAB — CBC WITH DIFFERENTIAL/PLATELET
BASOS ABS: 0 10*3/uL (ref 0.0–0.1)
Basophil %: 0.2 %
EOS ABS: 0 10*3/uL (ref 0.0–0.7)
EOS PCT: 0.8 %
HCT: 20.1 % — ABNORMAL LOW (ref 40.0–52.0)
HGB: 6.9 g/dL — AB (ref 13.0–18.0)
LYMPHS ABS: 0.5 10*3/uL — AB (ref 1.0–3.6)
LYMPHS PCT: 52.9 %
MCH: 34.2 pg — AB (ref 26.0–34.0)
MCHC: 34.4 g/dL (ref 32.0–36.0)
MCV: 100 fL (ref 80–100)
MONO ABS: 0 x10 3/mm — AB (ref 0.2–1.0)
Monocyte %: 2.4 %
NEUTROS PCT: 43.7 %
Neutrophil #: 0.4 10*3/uL — ABNORMAL LOW (ref 1.4–6.5)
PLATELETS: 31 10*3/uL — AB (ref 150–440)
RBC: 2.02 10*6/uL — ABNORMAL LOW (ref 4.40–5.90)
RDW: 24 % — ABNORMAL HIGH (ref 11.5–14.5)
WBC: 0.9 10*3/uL — CL (ref 3.8–10.6)

## 2014-10-10 LAB — COMPREHENSIVE METABOLIC PANEL
ANION GAP: 7 (ref 7–16)
Albumin: 1.8 g/dL — ABNORMAL LOW (ref 3.4–5.0)
Alkaline Phosphatase: 85 U/L
BUN: 17 mg/dL (ref 7–18)
Bilirubin,Total: 0.8 mg/dL (ref 0.2–1.0)
CALCIUM: 6.8 mg/dL — AB (ref 8.5–10.1)
CHLORIDE: 108 mmol/L — AB (ref 98–107)
Co2: 24 mmol/L (ref 21–32)
Creatinine: 0.81 mg/dL (ref 0.60–1.30)
EGFR (African American): >60
EGFR (Non-African Amer.): >60
Glucose: 133 mg/dL — ABNORMAL HIGH (ref 65–99)
Osmolality: 281 (ref 275–301)
Potassium: 4.2 mmol/L (ref 3.5–5.1)
SGOT(AST): 18 U/L (ref 15–37)
SGPT (ALT): 29 U/L
Sodium: 139 mmol/L (ref 136–145)
TOTAL PROTEIN: 5.3 g/dL — AB (ref 6.4–8.2)

## 2014-10-10 LAB — CULTURE, BLOOD (SINGLE)

## 2014-10-10 LAB — MAGNESIUM: MAGNESIUM: 1.6 mg/dL — AB

## 2014-10-11 LAB — CBC WITH DIFFERENTIAL/PLATELET
Basophil #: 0 10*3/uL (ref 0.0–0.1)
Basophil %: 0.3 %
Eosinophil #: 0 10*3/uL (ref 0.0–0.7)
Eosinophil %: 0.5 %
HCT: 21.2 % — ABNORMAL LOW (ref 40.0–52.0)
HGB: 7.5 g/dL — ABNORMAL LOW (ref 13.0–18.0)
Lymphocyte #: 0.5 10*3/uL — ABNORMAL LOW (ref 1.0–3.6)
Lymphocyte %: 52.2 %
MCH: 33.7 pg (ref 26.0–34.0)
MCHC: 35.1 g/dL (ref 32.0–36.0)
MCV: 96 fL (ref 80–100)
MONO ABS: 0 x10 3/mm — AB (ref 0.2–1.0)
Monocyte %: 0.8 %
NEUTROS ABS: 0.4 10*3/uL — AB (ref 1.4–6.5)
NEUTROS PCT: 46.2 %
Platelet: 27 10*3/uL — CL (ref 150–440)
RBC: 2.21 10*6/uL — AB (ref 4.40–5.90)
RDW: 21 % — AB (ref 11.5–14.5)
WBC: 0.9 10*3/uL — AB (ref 3.8–10.6)

## 2014-10-11 LAB — MAGNESIUM: Magnesium: 1.7 mg/dL — ABNORMAL LOW

## 2014-10-12 LAB — CBC WITH DIFFERENTIAL/PLATELET
BASOS ABS: 0 10*3/uL (ref 0.0–0.1)
BASOS PCT: 0.3 %
EOS ABS: 0 10*3/uL (ref 0.0–0.7)
EOS PCT: 0.4 %
HCT: 19.7 % — AB (ref 40.0–52.0)
HGB: 6.8 g/dL — ABNORMAL LOW (ref 13.0–18.0)
LYMPHS ABS: 0.5 10*3/uL — AB (ref 1.0–3.6)
Lymphocyte %: 56.6 %
MCH: 33.4 pg (ref 26.0–34.0)
MCHC: 34.4 g/dL (ref 32.0–36.0)
MCV: 97 fL (ref 80–100)
MONO ABS: 0 x10 3/mm — AB (ref 0.2–1.0)
MONOS PCT: 0.8 %
Neutrophil #: 0.4 10*3/uL — ABNORMAL LOW (ref 1.4–6.5)
Neutrophil %: 41.9 %
Platelet: 25 10*3/uL — CL (ref 150–440)
RBC: 2.03 10*6/uL — ABNORMAL LOW (ref 4.40–5.90)
RDW: 21.4 % — ABNORMAL HIGH (ref 11.5–14.5)
WBC: 0.9 10*3/uL — AB (ref 3.8–10.6)

## 2014-10-12 LAB — BASIC METABOLIC PANEL
ANION GAP: 7 (ref 7–16)
BUN: 21 mg/dL — AB (ref 7–18)
CO2: 28 mmol/L (ref 21–32)
Calcium, Total: 6.8 mg/dL — CL (ref 8.5–10.1)
Chloride: 106 mmol/L (ref 98–107)
Creatinine: 0.86 mg/dL (ref 0.60–1.30)
EGFR (African American): >60
EGFR (Non-African Amer.): >60
Glucose: 116 mg/dL — ABNORMAL HIGH (ref 65–99)
Osmolality: 285 (ref 275–301)
POTASSIUM: 3.9 mmol/L (ref 3.5–5.1)
Sodium: 141 mmol/L (ref 136–145)

## 2014-10-12 LAB — MAGNESIUM: Magnesium: 1.7 mg/dL — ABNORMAL LOW

## 2014-10-13 LAB — CBC WITH DIFFERENTIAL/PLATELET
Bands: 4 %
HCT: 23.8 % — AB (ref 40.0–52.0)
HGB: 8 g/dL — AB (ref 13.0–18.0)
Lymphocytes: 28 %
MCH: 32.7 pg (ref 26.0–34.0)
MCHC: 33.6 g/dL (ref 32.0–36.0)
MCV: 97 fL (ref 80–100)
METAMYELOCYTE: 8 %
MONOS PCT: 11 %
Myelocyte: 5 %
Platelet: 31 10*3/uL — ABNORMAL LOW (ref 150–440)
RBC: 2.45 10*6/uL — ABNORMAL LOW (ref 4.40–5.90)
RDW: 19.1 % — ABNORMAL HIGH (ref 11.5–14.5)
Segmented Neutrophils: 44 %
WBC: 3.6 10*3/uL — ABNORMAL LOW (ref 3.8–10.6)

## 2014-10-13 LAB — BASIC METABOLIC PANEL
Anion Gap: 4 — ABNORMAL LOW (ref 7–16)
BUN: 17 mg/dL (ref 7–18)
CO2: 30 mmol/L (ref 21–32)
CREATININE: 0.84 mg/dL (ref 0.60–1.30)
Calcium, Total: 7.2 mg/dL — ABNORMAL LOW (ref 8.5–10.1)
Chloride: 105 mmol/L (ref 98–107)
EGFR (African American): >60
EGFR (Non-African Amer.): >60
Glucose: 95 mg/dL (ref 65–99)
OSMOLALITY: 279 (ref 275–301)
Potassium: 4.3 mmol/L (ref 3.5–5.1)
SODIUM: 139 mmol/L (ref 136–145)

## 2014-10-13 LAB — MAGNESIUM: MAGNESIUM: 1.6 mg/dL — AB

## 2014-10-16 ENCOUNTER — Ambulatory Visit: Payer: Self-pay | Admitting: Oncology

## 2014-10-16 LAB — CBC CANCER CENTER
BASOS ABS: 0 x10 3/mm (ref 0.0–0.1)
Basophil #: 0 x10 3/mm (ref 0.0–0.1)
Basophil %: 0.2 %
Basophil %: 0.2 %
EOS ABS: 0 x10 3/mm (ref 0.0–0.7)
Eosinophil #: 0 x10 3/mm (ref 0.0–0.7)
Eosinophil %: 0.4 %
Eosinophil %: 0.5 %
HCT: 26.3 % — AB (ref 40.0–52.0)
HCT: 24.3 % — AB (ref 40.0–52.0)
HGB: 8.9 g/dL — ABNORMAL LOW (ref 13.0–18.0)
HGB: 8.2 g/dL — ABNORMAL LOW (ref 13.0–18.0)
LYMPHS PCT: 24.1 %
Lymphocyte #: 1 x10 3/mm (ref 1.0–3.6)
Lymphocyte #: 1 x10 3/mm (ref 1.0–3.6)
Lymphocyte %: 25.7 %
MCH: 32.3 pg (ref 26.0–34.0)
MCH: 32 pg (ref 26.0–34.0)
MCHC: 34 g/dL (ref 32.0–36.0)
MCHC: 33.7 g/dL (ref 32.0–36.0)
MCV: 95 fL (ref 80–100)
MCV: 95 fL (ref 80–100)
MONO ABS: 0.1 x10 3/mm — AB (ref 0.2–1.0)
Monocyte #: 0 x10 3/mm — ABNORMAL LOW (ref 0.2–1.0)
Monocyte %: 1.7 %
Monocyte %: 0.5 %
NEUTROS ABS: 3 x10 3/mm (ref 1.4–6.5)
NEUTROS PCT: 73.6 %
NEUTROS PCT: 73.1 %
Neutrophil #: 2.8 x10 3/mm (ref 1.4–6.5)
Platelet: 19 x10 3/mm — CL (ref 150–440)
RBC: 2.77 10*6/uL — ABNORMAL LOW (ref 4.40–5.90)
RBC: 2.57 10*6/uL — ABNORMAL LOW (ref 4.40–5.90)
RDW: 18.4 % — AB (ref 11.5–14.5)
RDW: 19.7 % — AB (ref 11.5–14.5)
WBC: 4.1 x10 3/mm (ref 3.8–10.6)
WBC: 3.8 x10 3/mm (ref 3.8–10.6)

## 2014-10-20 LAB — CBC CANCER CENTER
BASOS ABS: 2 %
Bands: 2 %
Blast: 2 %
HCT: 19.7 % — AB (ref 40.0–52.0)
HGB: 6.5 g/dL — AB (ref 13.0–18.0)
Lymphocytes: 70 %
MCH: 31.5 pg (ref 26.0–34.0)
MCHC: 32.7 g/dL (ref 32.0–36.0)
MCV: 96 fL (ref 80–100)
MONOS PCT: 2 %
Metamyelocyte: 2 %
Platelet: 18 x10 3/mm — CL (ref 150–440)
RBC: 2.05 10*6/uL — AB (ref 4.40–5.90)
RDW: 16.3 % — AB (ref 11.5–14.5)
Segmented Neutrophils: 18 %
Variant Lymphocyte: 2 %
WBC: 0.7 x10 3/mm — CL (ref 3.8–10.6)

## 2014-10-23 LAB — CBC CANCER CENTER
BASOS ABS: 0 x10 3/mm (ref 0.0–0.1)
Basophil %: 0.3 %
Eosinophil #: 0 x10 3/mm (ref 0.0–0.7)
Eosinophil %: 0.8 %
HCT: 20.9 % — ABNORMAL LOW (ref 40.0–52.0)
HGB: 6.8 g/dL — AB (ref 13.0–18.0)
LYMPHS ABS: 0.4 x10 3/mm — AB (ref 1.0–3.6)
Lymphocyte %: 55.3 %
MCH: 31.1 pg (ref 26.0–34.0)
MCHC: 32.6 g/dL (ref 32.0–36.0)
MCV: 95 fL (ref 80–100)
MONOS PCT: 1 %
Monocyte #: 0 x10 3/mm — ABNORMAL LOW (ref 0.2–1.0)
NEUTROS ABS: 0.3 x10 3/mm — AB (ref 1.4–6.5)
NEUTROS PCT: 42.6 %
Platelet: 43 x10 3/mm — ABNORMAL LOW (ref 150–440)
RBC: 2.19 10*6/uL — ABNORMAL LOW (ref 4.40–5.90)
RDW: 17 % — ABNORMAL HIGH (ref 11.5–14.5)
WBC: 0.8 x10 3/mm — AB (ref 3.8–10.6)

## 2014-10-26 LAB — CBC CANCER CENTER
BASOS ABS: 0 x10 3/mm (ref 0.0–0.1)
BASOS PCT: 0.5 %
EOS ABS: 0 x10 3/mm (ref 0.0–0.7)
EOS PCT: 0.7 %
HCT: 23.4 % — ABNORMAL LOW (ref 40.0–52.0)
HGB: 7.7 g/dL — AB (ref 13.0–18.0)
Lymphocyte #: 0.5 x10 3/mm — ABNORMAL LOW (ref 1.0–3.6)
Lymphocyte %: 61.7 %
MCH: 30.5 pg (ref 26.0–34.0)
MCHC: 32.8 g/dL (ref 32.0–36.0)
MCV: 93 fL (ref 80–100)
MONOS PCT: 0.7 %
Monocyte #: 0 x10 3/mm — ABNORMAL LOW (ref 0.2–1.0)
NEUTROS ABS: 0.3 x10 3/mm — AB (ref 1.4–6.5)
NEUTROS PCT: 36.4 %
Platelet: 29 x10 3/mm — CL (ref 150–440)
RBC: 2.52 10*6/uL — ABNORMAL LOW (ref 4.40–5.90)
RDW: 15.6 % — ABNORMAL HIGH (ref 11.5–14.5)
WBC: 0.8 x10 3/mm — AB (ref 3.8–10.6)

## 2014-10-28 LAB — CBC CANCER CENTER
BASOS PCT: 0.3 %
Basophil #: 0 x10 3/mm (ref 0.0–0.1)
EOS ABS: 0 x10 3/mm (ref 0.0–0.7)
Eosinophil %: 2.6 %
HCT: 21.3 % — ABNORMAL LOW (ref 40.0–52.0)
HGB: 7.1 g/dL — AB (ref 13.0–18.0)
Lymphocyte #: 0.6 x10 3/mm — ABNORMAL LOW (ref 1.0–3.6)
Lymphocyte %: 57 %
MCH: 30.9 pg (ref 26.0–34.0)
MCHC: 33.3 g/dL (ref 32.0–36.0)
MCV: 93 fL (ref 80–100)
MONOS PCT: 1 %
Monocyte #: 0 x10 3/mm — ABNORMAL LOW (ref 0.2–1.0)
Neutrophil #: 0.4 x10 3/mm — ABNORMAL LOW (ref 1.4–6.5)
Neutrophil %: 39.1 %
Platelet: 24 x10 3/mm — CL (ref 150–440)
RBC: 2.3 10*6/uL — ABNORMAL LOW (ref 4.40–5.90)
RDW: 15.8 % — ABNORMAL HIGH (ref 11.5–14.5)
WBC: 1.1 x10 3/mm — CL (ref 3.8–10.6)

## 2014-11-02 LAB — CBC CANCER CENTER
BASOS PCT: 1.1 %
Basophil #: 0 x10 3/mm (ref 0.0–0.1)
Eosinophil #: 0 x10 3/mm (ref 0.0–0.7)
Eosinophil %: 1 %
HCT: 17.1 % — AB (ref 40.0–52.0)
HGB: 5.8 g/dL — ABNORMAL LOW (ref 13.0–18.0)
LYMPHS ABS: 0.4 x10 3/mm — AB (ref 1.0–3.6)
Lymphocyte %: 57 %
MCH: 30.8 pg (ref 26.0–34.0)
MCHC: 33.7 g/dL (ref 32.0–36.0)
MCV: 92 fL (ref 80–100)
Monocyte #: 0 x10 3/mm — ABNORMAL LOW (ref 0.2–1.0)
Monocyte %: 0.5 %
NEUTROS PCT: 40.4 %
Neutrophil #: 0.3 x10 3/mm — ABNORMAL LOW (ref 1.4–6.5)
Platelet: 23 x10 3/mm — CL (ref 150–440)
RBC: 1.87 10*6/uL — ABNORMAL LOW (ref 4.40–5.90)
RDW: 15.1 % — ABNORMAL HIGH (ref 11.5–14.5)
WBC: 0.7 x10 3/mm — AB (ref 3.8–10.6)

## 2014-11-02 LAB — HEPATIC FUNCTION PANEL A (ARMC)
ALBUMIN: 2.6 g/dL — AB (ref 3.4–5.0)
ALT: 19 U/L
Alkaline Phosphatase: 118 U/L — ABNORMAL HIGH
Bilirubin, Direct: 0.1 mg/dL (ref 0.0–0.2)
Bilirubin,Total: 0.5 mg/dL (ref 0.2–1.0)
SGOT(AST): 12 U/L — ABNORMAL LOW (ref 15–37)
Total Protein: 6.6 g/dL (ref 6.4–8.2)

## 2014-11-02 LAB — CREATININE, SERUM
CREATININE: 1.11 mg/dL (ref 0.60–1.30)
EGFR (African American): >60
EGFR (Non-African Amer.): >60

## 2014-11-03 ENCOUNTER — Ambulatory Visit: Payer: Self-pay | Admitting: Internal Medicine

## 2014-11-05 LAB — CBC CANCER CENTER
Basophil #: 0 x10 3/mm (ref 0.0–0.1)
Basophil %: 0.3 %
EOS PCT: 1.1 %
Eosinophil #: 0 x10 3/mm (ref 0.0–0.7)
HCT: 17.3 % — ABNORMAL LOW (ref 40.0–52.0)
HGB: 5.8 g/dL — ABNORMAL LOW (ref 13.0–18.0)
Lymphocyte #: 0.5 x10 3/mm — ABNORMAL LOW (ref 1.0–3.6)
Lymphocyte %: 63 %
MCH: 30.3 pg (ref 26.0–34.0)
MCHC: 33.3 g/dL (ref 32.0–36.0)
MCV: 91 fL (ref 80–100)
Monocyte #: 0 x10 3/mm — ABNORMAL LOW (ref 0.2–1.0)
Monocyte %: 3.7 %
NEUTROS PCT: 31.9 %
Neutrophil #: 0.2 x10 3/mm — ABNORMAL LOW (ref 1.4–6.5)
Platelet: 49 x10 3/mm — ABNORMAL LOW (ref 150–440)
RBC: 1.9 10*6/uL — AB (ref 4.40–5.90)
RDW: 15.3 % — ABNORMAL HIGH (ref 11.5–14.5)
WBC: 0.7 x10 3/mm — CL (ref 3.8–10.6)

## 2014-11-09 LAB — CBC CANCER CENTER
BASOS PCT: 0.5 %
Basophil #: 0 x10 3/mm (ref 0.0–0.1)
EOS ABS: 0 x10 3/mm (ref 0.0–0.7)
EOS PCT: 1.3 %
HCT: 15 % — CL (ref 40.0–52.0)
HGB: 5 g/dL — CL (ref 13.0–18.0)
Lymphocyte #: 0.4 x10 3/mm — ABNORMAL LOW (ref 1.0–3.6)
Lymphocyte %: 76.4 %
MCH: 29.9 pg (ref 26.0–34.0)
MCHC: 33.3 g/dL (ref 32.0–36.0)
MCV: 90 fL (ref 80–100)
MONOS PCT: 1.2 %
Monocyte #: 0 x10 3/mm — ABNORMAL LOW (ref 0.2–1.0)
NEUTROS PCT: 20.6 %
Neutrophil #: 0.1 x10 3/mm — ABNORMAL LOW (ref 1.4–6.5)
PLATELETS: 33 x10 3/mm — AB (ref 150–440)
RBC: 1.68 10*6/uL — ABNORMAL LOW (ref 4.40–5.90)
RDW: 15.5 % — AB (ref 11.5–14.5)
WBC: 0.5 x10 3/mm — AB (ref 3.8–10.6)

## 2014-11-12 LAB — CBC CANCER CENTER
BASOS PCT: 0.4 %
Basophil #: 0 x10 3/mm (ref 0.0–0.1)
Eosinophil #: 0 x10 3/mm (ref 0.0–0.7)
Eosinophil %: 1.1 %
HCT: 17.6 % — ABNORMAL LOW (ref 40.0–52.0)
HGB: 5.8 g/dL — ABNORMAL LOW (ref 13.0–18.0)
LYMPHS ABS: 0.3 x10 3/mm — AB (ref 1.0–3.6)
LYMPHS PCT: 62.8 %
MCH: 29.1 pg (ref 26.0–34.0)
MCHC: 32.9 g/dL (ref 32.0–36.0)
MCV: 88 fL (ref 80–100)
MONOS PCT: 1.5 %
Monocyte #: 0 x10 3/mm — ABNORMAL LOW (ref 0.2–1.0)
NEUTROS ABS: 0.2 x10 3/mm — AB (ref 1.4–6.5)
Neutrophil %: 34.2 %
Platelet: 18 x10 3/mm — CL (ref 150–440)
RBC: 1.99 10*6/uL — AB (ref 4.40–5.90)
RDW: 16.1 % — ABNORMAL HIGH (ref 11.5–14.5)
WBC: 0.5 x10 3/mm — CL (ref 3.8–10.6)

## 2014-11-16 ENCOUNTER — Inpatient Hospital Stay: Payer: Self-pay | Admitting: Internal Medicine

## 2014-11-16 LAB — CBC CANCER CENTER
Basophil #: 0 x10 3/mm (ref 0.0–0.1)
Basophil %: 0.1 %
Eosinophil #: 0 x10 3/mm (ref 0.0–0.7)
Eosinophil %: 0.4 %
HCT: 10.4 % — AB (ref 40.0–52.0)
HGB: 3.5 g/dL — AB (ref 13.0–18.0)
LYMPHS ABS: 0.5 x10 3/mm — AB (ref 1.0–3.6)
Lymphocyte %: 52.2 %
MCH: 29.6 pg (ref 26.0–34.0)
MCHC: 33.8 g/dL (ref 32.0–36.0)
MCV: 88 fL (ref 80–100)
Monocyte #: 0 x10 3/mm — ABNORMAL LOW (ref 0.2–1.0)
Monocyte %: 0.6 %
NEUTROS PCT: 46.7 %
Neutrophil #: 0.4 x10 3/mm — ABNORMAL LOW (ref 1.4–6.5)
PLATELETS: 30 x10 3/mm — AB (ref 150–440)
RBC: 1.19 10*6/uL — ABNORMAL LOW (ref 4.40–5.90)
RDW: 15.6 % — ABNORMAL HIGH (ref 11.5–14.5)
WBC: 0.9 x10 3/mm — AB (ref 3.8–10.6)

## 2014-11-16 LAB — COMPREHENSIVE METABOLIC PANEL
AST: 9 U/L — AB (ref 15–37)
Albumin: 1.9 g/dL — ABNORMAL LOW (ref 3.4–5.0)
Alkaline Phosphatase: 62 U/L
Anion Gap: 10 (ref 7–16)
BUN: 70 mg/dL — AB (ref 7–18)
Bilirubin,Total: 0.3 mg/dL (ref 0.2–1.0)
CREATININE: 1.42 mg/dL — AB (ref 0.60–1.30)
Calcium, Total: 6.6 mg/dL — CL (ref 8.5–10.1)
Chloride: 111 mmol/L — ABNORMAL HIGH (ref 98–107)
Co2: 19 mmol/L — ABNORMAL LOW (ref 21–32)
EGFR (Non-African Amer.): 52 — ABNORMAL LOW
GLUCOSE: 193 mg/dL — AB (ref 65–99)
OSMOLALITY: 305 (ref 275–301)
Potassium: 3.9 mmol/L (ref 3.5–5.1)
SGPT (ALT): 11 U/L — ABNORMAL LOW
SODIUM: 140 mmol/L (ref 136–145)
Total Protein: 4.6 g/dL — ABNORMAL LOW (ref 6.4–8.2)

## 2014-11-16 LAB — CREATININE, SERUM
CREATININE: 1.47 mg/dL — AB (ref 0.60–1.30)
EGFR (Non-African Amer.): 50 — ABNORMAL LOW

## 2014-11-16 LAB — CK TOTAL AND CKMB (NOT AT ARMC)
CK, TOTAL: 21 U/L — AB (ref 39–308)
CK-MB: 3 ng/mL (ref 0.5–3.6)

## 2014-11-17 LAB — CBC WITH DIFFERENTIAL/PLATELET
Bands: 0 %
Comment - H1-Com3: NORMAL
Eosinophil: 1 %
HCT: 14.8 % — CL (ref 40.0–52.0)
HGB: 5.2 g/dL — ABNORMAL LOW (ref 13.0–18.0)
Lymphocytes: 77 %
MCH: 30.1 pg (ref 26.0–34.0)
MCHC: 34.8 g/dL (ref 32.0–36.0)
MCV: 87 fL (ref 80–100)
Monocytes: 1 %
OTHER CELLS BLOOD: 5
PLATELETS: 15 10*3/uL — AB (ref 150–440)
RBC: 1.72 10*6/uL — ABNORMAL LOW (ref 4.40–5.90)
RDW: 14.4 % (ref 11.5–14.5)
SEGMENTED NEUTROPHILS: 16 %
WBC: 0.7 10*3/uL — CL (ref 3.8–10.6)

## 2014-11-17 LAB — BASIC METABOLIC PANEL
ANION GAP: 8 (ref 7–16)
BUN: 66 mg/dL — AB (ref 7–18)
CHLORIDE: 110 mmol/L — AB (ref 98–107)
CREATININE: 1.32 mg/dL — AB (ref 0.60–1.30)
Calcium, Total: 7.5 mg/dL — ABNORMAL LOW (ref 8.5–10.1)
Co2: 23 mmol/L (ref 21–32)
EGFR (Non-African Amer.): 57 — ABNORMAL LOW
GLUCOSE: 201 mg/dL — AB (ref 65–99)
Osmolality: 306 (ref 275–301)
Potassium: 3.9 mmol/L (ref 3.5–5.1)
SODIUM: 141 mmol/L (ref 136–145)

## 2014-11-18 DIAGNOSIS — I313 Pericardial effusion (noninflammatory): Secondary | ICD-10-CM

## 2014-11-18 LAB — CBC WITH DIFFERENTIAL/PLATELET
BASOS ABS: 0 10*3/uL (ref 0.0–0.1)
BASOS PCT: 0.2 %
Eosinophil #: 0 10*3/uL (ref 0.0–0.7)
Eosinophil %: 0.6 %
HCT: 22.3 % — ABNORMAL LOW (ref 40.0–52.0)
HGB: 7.6 g/dL — ABNORMAL LOW (ref 13.0–18.0)
LYMPHS ABS: 0.5 10*3/uL — AB (ref 1.0–3.6)
LYMPHS PCT: 63 %
MCH: 29.5 pg (ref 26.0–34.0)
MCHC: 34.1 g/dL (ref 32.0–36.0)
MCV: 87 fL (ref 80–100)
MONO ABS: 0 x10 3/mm — AB (ref 0.2–1.0)
Monocyte %: 0.7 %
NEUTROS PCT: 35.5 %
Neutrophil #: 0.3 10*3/uL — ABNORMAL LOW (ref 1.4–6.5)
PLATELETS: 26 10*3/uL — AB (ref 150–440)
RBC: 2.58 10*6/uL — AB (ref 4.40–5.90)
RDW: 14.9 % — ABNORMAL HIGH (ref 11.5–14.5)
WBC: 0.8 10*3/uL — CL (ref 3.8–10.6)

## 2014-11-18 LAB — BASIC METABOLIC PANEL
Anion Gap: 9 (ref 7–16)
BUN: 40 mg/dL — ABNORMAL HIGH (ref 7–18)
CREATININE: 1.02 mg/dL (ref 0.60–1.30)
Calcium, Total: 7.3 mg/dL — ABNORMAL LOW (ref 8.5–10.1)
Chloride: 111 mmol/L — ABNORMAL HIGH (ref 98–107)
Co2: 22 mmol/L (ref 21–32)
EGFR (Non-African Amer.): >60
GLUCOSE: 115 mg/dL — AB (ref 65–99)
Osmolality: 294 (ref 275–301)
Potassium: 4.1 mmol/L (ref 3.5–5.1)
Sodium: 142 mmol/L (ref 136–145)

## 2014-11-18 LAB — OCCULT BLOOD X 1 CARD TO LAB, STOOL: Occult Blood, Feces: POSITIVE

## 2014-11-19 LAB — BASIC METABOLIC PANEL
ANION GAP: 8 (ref 7–16)
Anion Gap: 7 (ref 7–16)
BUN: 22 mg/dL — ABNORMAL HIGH (ref 7–18)
BUN: 21 mg/dL — ABNORMAL HIGH (ref 7–18)
CALCIUM: 7.3 mg/dL — AB (ref 8.5–10.1)
CHLORIDE: 107 mmol/L (ref 98–107)
CHLORIDE: 106 mmol/L (ref 98–107)
CO2: 23 mmol/L (ref 21–32)
CO2: 25 mmol/L (ref 21–32)
CREATININE: 1.02 mg/dL (ref 0.60–1.30)
Calcium, Total: 7.6 mg/dL — ABNORMAL LOW (ref 8.5–10.1)
Creatinine: 1.02 mg/dL (ref 0.60–1.30)
EGFR (African American): >60
EGFR (African American): >60
EGFR (Non-African Amer.): >60
GLUCOSE: 159 mg/dL — AB (ref 65–99)
Glucose: 208 mg/dL — ABNORMAL HIGH (ref 65–99)
Osmolality: 283 (ref 275–301)
Osmolality: 284 (ref 275–301)
Potassium: 3.8 mmol/L (ref 3.5–5.1)
Potassium: 4.2 mmol/L (ref 3.5–5.1)
Sodium: 137 mmol/L (ref 136–145)
Sodium: 139 mmol/L (ref 136–145)

## 2014-11-19 LAB — CBC WITH DIFFERENTIAL/PLATELET
Basophil #: 0 10*3/uL (ref 0.0–0.1)
Basophil %: 0.5 %
Eosinophil #: 0 10*3/uL (ref 0.0–0.7)
Eosinophil %: 0.6 %
HCT: 26.3 % — AB (ref 40.0–52.0)
HGB: 9.1 g/dL — ABNORMAL LOW (ref 13.0–18.0)
Lymphocyte #: 0.4 10*3/uL — ABNORMAL LOW (ref 1.0–3.6)
Lymphocyte %: 30 %
MCH: 30 pg (ref 26.0–34.0)
MCHC: 34.6 g/dL (ref 32.0–36.0)
MCV: 87 fL (ref 80–100)
MONO ABS: 0 x10 3/mm — AB (ref 0.2–1.0)
Monocyte %: 3.3 %
Neutrophil #: 0.8 10*3/uL — ABNORMAL LOW (ref 1.4–6.5)
Neutrophil %: 65.6 %
PLATELETS: 28 10*3/uL — AB (ref 150–440)
RBC: 3.03 10*6/uL — ABNORMAL LOW (ref 4.40–5.90)
RDW: 15 % — ABNORMAL HIGH (ref 11.5–14.5)
WBC: 1.2 10*3/uL — CL (ref 3.8–10.6)

## 2014-11-19 LAB — VANCOMYCIN, TROUGH: Vancomycin, Trough: 8 ug/mL — ABNORMAL LOW (ref 10–20)

## 2014-11-20 ENCOUNTER — Other Ambulatory Visit: Payer: Self-pay | Admitting: Physician Assistant

## 2014-11-20 DIAGNOSIS — I959 Hypotension, unspecified: Secondary | ICD-10-CM

## 2014-11-20 DIAGNOSIS — I429 Cardiomyopathy, unspecified: Secondary | ICD-10-CM

## 2014-11-20 DIAGNOSIS — I5043 Acute on chronic combined systolic (congestive) and diastolic (congestive) heart failure: Secondary | ICD-10-CM

## 2014-11-20 LAB — BASIC METABOLIC PANEL
Anion Gap: 7 (ref 7–16)
BUN: 19 mg/dL — AB (ref 7–18)
CREATININE: 0.93 mg/dL (ref 0.60–1.30)
Calcium, Total: 7.4 mg/dL — ABNORMAL LOW (ref 8.5–10.1)
Chloride: 106 mmol/L (ref 98–107)
Co2: 26 mmol/L (ref 21–32)
EGFR (Non-African Amer.): >60
GLUCOSE: 122 mg/dL — AB (ref 65–99)
Osmolality: 281 (ref 275–301)
POTASSIUM: 3.8 mmol/L (ref 3.5–5.1)
Sodium: 139 mmol/L (ref 136–145)

## 2014-11-20 LAB — CBC WITH DIFFERENTIAL/PLATELET
BASOS ABS: 0 10*3/uL (ref 0.0–0.1)
Basophil %: 0.4 %
EOS PCT: 0.8 %
Eosinophil #: 0 10*3/uL (ref 0.0–0.7)
HCT: 23.8 % — AB (ref 40.0–52.0)
HGB: 7.9 g/dL — AB (ref 13.0–18.0)
Lymphocyte #: 0.5 10*3/uL — ABNORMAL LOW (ref 1.0–3.6)
Lymphocyte %: 66.4 %
MCH: 29.2 pg (ref 26.0–34.0)
MCHC: 33.3 g/dL (ref 32.0–36.0)
MCV: 88 fL (ref 80–100)
Monocyte #: 0 x10 3/mm — ABNORMAL LOW (ref 0.2–1.0)
Monocyte %: 0.4 %
NEUTROS PCT: 32 %
Neutrophil #: 0.2 10*3/uL — ABNORMAL LOW (ref 1.4–6.5)
Platelet: 23 10*3/uL — CL (ref 150–440)
RBC: 2.72 10*6/uL — AB (ref 4.40–5.90)
RDW: 14.9 % — AB (ref 11.5–14.5)
WBC: 0.7 10*3/uL — CL (ref 3.8–10.6)

## 2014-11-20 LAB — URINE CULTURE

## 2014-11-21 LAB — BASIC METABOLIC PANEL
Anion Gap: 7 (ref 7–16)
BUN: 16 mg/dL (ref 7–18)
CO2: 27 mmol/L (ref 21–32)
Calcium, Total: 7.6 mg/dL — ABNORMAL LOW (ref 8.5–10.1)
Chloride: 104 mmol/L (ref 98–107)
Creatinine: 0.92 mg/dL (ref 0.60–1.30)
EGFR (African American): >60
EGFR (Non-African Amer.): >60
Glucose: 130 mg/dL — ABNORMAL HIGH (ref 65–99)
Osmolality: 279 (ref 275–301)
Potassium: 4 mmol/L (ref 3.5–5.1)
Sodium: 138 mmol/L (ref 136–145)

## 2014-11-21 LAB — CBC WITH DIFFERENTIAL/PLATELET
Basophil #: 0 10*3/uL (ref 0.0–0.1)
Basophil %: 0.2 %
EOS PCT: 2.1 %
Eosinophil #: 0 10*3/uL (ref 0.0–0.7)
HCT: 23.5 % — ABNORMAL LOW (ref 40.0–52.0)
HGB: 7.8 g/dL — ABNORMAL LOW (ref 13.0–18.0)
LYMPHS ABS: 0.5 10*3/uL — AB (ref 1.0–3.6)
LYMPHS PCT: 56.4 %
MCH: 29.1 pg (ref 26.0–34.0)
MCHC: 33.3 g/dL (ref 32.0–36.0)
MCV: 87 fL (ref 80–100)
MONO ABS: 0 x10 3/mm — AB (ref 0.2–1.0)
MONOS PCT: 0.7 %
NEUTROS ABS: 0.3 10*3/uL — AB (ref 1.4–6.5)
Neutrophil %: 40.6 %
Platelet: 26 10*3/uL — CL (ref 150–440)
RBC: 2.69 10*6/uL — ABNORMAL LOW (ref 4.40–5.90)
RDW: 14.9 % — AB (ref 11.5–14.5)
WBC: 0.8 10*3/uL — AB (ref 3.8–10.6)

## 2014-11-21 LAB — CULTURE, BLOOD (SINGLE)

## 2014-11-23 LAB — CBC CANCER CENTER
BASOS ABS: 0 x10 3/mm (ref 0.0–0.1)
Basophil %: 0.5 %
Eosinophil #: 0 x10 3/mm (ref 0.0–0.7)
Eosinophil %: 0.5 %
HCT: 23.5 % — ABNORMAL LOW (ref 40.0–52.0)
HGB: 7.7 g/dL — AB (ref 13.0–18.0)
LYMPHS PCT: 63.4 %
Lymphocyte #: 0.4 x10 3/mm — ABNORMAL LOW (ref 1.0–3.6)
MCH: 28.8 pg (ref 26.0–34.0)
MCHC: 32.8 g/dL (ref 32.0–36.0)
MCV: 88 fL (ref 80–100)
Monocyte #: 0 x10 3/mm — ABNORMAL LOW (ref 0.2–1.0)
Monocyte %: 1.5 %
Neutrophil #: 0.2 x10 3/mm — ABNORMAL LOW (ref 1.4–6.5)
Neutrophil %: 34.1 %
Platelet: 44 x10 3/mm — ABNORMAL LOW (ref 150–440)
RBC: 2.68 10*6/uL — AB (ref 4.40–5.90)
RDW: 14.7 % — ABNORMAL HIGH (ref 11.5–14.5)
WBC: 0.7 x10 3/mm — CL (ref 3.8–10.6)

## 2014-11-26 LAB — CBC CANCER CENTER
BASOS ABS: 0 x10 3/mm (ref 0.0–0.1)
Basophil %: 0.6 %
EOS ABS: 0 x10 3/mm (ref 0.0–0.7)
EOS PCT: 0.9 %
HCT: 21.5 % — AB (ref 40.0–52.0)
HGB: 7.2 g/dL — ABNORMAL LOW (ref 13.0–18.0)
Lymphocyte #: 0.4 x10 3/mm — ABNORMAL LOW (ref 1.0–3.6)
Lymphocyte %: 62.4 %
MCH: 29 pg (ref 26.0–34.0)
MCHC: 33.4 g/dL (ref 32.0–36.0)
MCV: 87 fL (ref 80–100)
MONO ABS: 0 x10 3/mm — AB (ref 0.2–1.0)
Monocyte %: 0.6 %
NEUTROS PCT: 35.5 %
Neutrophil #: 0.2 x10 3/mm — ABNORMAL LOW (ref 1.4–6.5)
Platelet: 57 x10 3/mm — ABNORMAL LOW (ref 150–440)
RBC: 2.48 10*6/uL — AB (ref 4.40–5.90)
RDW: 14.4 % (ref 11.5–14.5)
WBC: 0.6 x10 3/mm — AB (ref 3.8–10.6)

## 2014-11-26 LAB — HEPATIC FUNCTION PANEL A (ARMC)
ALT: 15 U/L
Albumin: 2.3 g/dL — ABNORMAL LOW (ref 3.4–5.0)
Alkaline Phosphatase: 84 U/L
Bilirubin, Direct: 0.1 mg/dL (ref 0.0–0.2)
Bilirubin,Total: 0.4 mg/dL (ref 0.2–1.0)
SGOT(AST): 9 U/L — ABNORMAL LOW (ref 15–37)
TOTAL PROTEIN: 6.2 g/dL — AB (ref 6.4–8.2)

## 2014-11-26 LAB — BASIC METABOLIC PANEL
Anion Gap: 7 (ref 7–16)
BUN: 14 mg/dL (ref 7–18)
CALCIUM: 7.7 mg/dL — AB (ref 8.5–10.1)
CHLORIDE: 106 mmol/L (ref 98–107)
CREATININE: 0.97 mg/dL (ref 0.60–1.30)
Co2: 27 mmol/L (ref 21–32)
EGFR (African American): >60
EGFR (Non-African Amer.): >60
GLUCOSE: 114 mg/dL — AB (ref 65–99)
OSMOLALITY: 281 (ref 275–301)
Potassium: 3.6 mmol/L (ref 3.5–5.1)
Sodium: 140 mmol/L (ref 136–145)

## 2014-11-30 LAB — CBC CANCER CENTER
Basophil #: 0 x10 3/mm (ref 0.0–0.1)
Basophil %: 0.7 %
Eosinophil #: 0 x10 3/mm (ref 0.0–0.7)
Eosinophil %: 0.5 %
HCT: 22.1 % — ABNORMAL LOW (ref 40.0–52.0)
HGB: 7.4 g/dL — ABNORMAL LOW (ref 13.0–18.0)
LYMPHS PCT: 77.4 %
Lymphocyte #: 0.3 x10 3/mm — ABNORMAL LOW (ref 1.0–3.6)
MCH: 29.3 pg (ref 26.0–34.0)
MCHC: 33.5 g/dL (ref 32.0–36.0)
MCV: 88 fL (ref 80–100)
MONOS PCT: 1.4 %
Monocyte #: 0 x10 3/mm — ABNORMAL LOW (ref 0.2–1.0)
Neutrophil #: 0.1 x10 3/mm — ABNORMAL LOW (ref 1.4–6.5)
Neutrophil %: 20 %
Platelet: 70 x10 3/mm — ABNORMAL LOW (ref 150–440)
RBC: 2.52 10*6/uL — ABNORMAL LOW (ref 4.40–5.90)
RDW: 14.5 % (ref 11.5–14.5)
WBC: 0.4 x10 3/mm — AB (ref 3.8–10.6)

## 2014-12-03 LAB — CBC CANCER CENTER
Basophil #: 0 x10 3/mm (ref 0.0–0.1)
Basophil %: 0.1 %
EOS ABS: 0 x10 3/mm (ref 0.0–0.7)
EOS PCT: 0.4 %
HCT: 22.1 % — ABNORMAL LOW (ref 40.0–52.0)
HGB: 7.3 g/dL — ABNORMAL LOW (ref 13.0–18.0)
Lymphocyte #: 0.8 x10 3/mm — ABNORMAL LOW (ref 1.0–3.6)
Lymphocyte %: 24.8 %
MCH: 28.8 pg (ref 26.0–34.0)
MCHC: 32.8 g/dL (ref 32.0–36.0)
MCV: 88 fL (ref 80–100)
Monocyte #: 0.2 x10 3/mm (ref 0.2–1.0)
Monocyte %: 5.7 %
NEUTROS ABS: 2.1 x10 3/mm (ref 1.4–6.5)
Neutrophil %: 69 %
Platelet: 86 x10 3/mm — ABNORMAL LOW (ref 150–440)
RBC: 2.53 10*6/uL — ABNORMAL LOW (ref 4.40–5.90)
RDW: 15.1 % — ABNORMAL HIGH (ref 11.5–14.5)
WBC: 3.1 x10 3/mm — AB (ref 3.8–10.6)

## 2014-12-04 ENCOUNTER — Ambulatory Visit: Payer: Self-pay | Admitting: Internal Medicine

## 2014-12-04 DIAGNOSIS — D696 Thrombocytopenia, unspecified: Secondary | ICD-10-CM | POA: Diagnosis not present

## 2014-12-04 DIAGNOSIS — Z5111 Encounter for antineoplastic chemotherapy: Secondary | ICD-10-CM | POA: Diagnosis not present

## 2014-12-04 DIAGNOSIS — I739 Peripheral vascular disease, unspecified: Secondary | ICD-10-CM | POA: Diagnosis not present

## 2014-12-04 DIAGNOSIS — Z7982 Long term (current) use of aspirin: Secondary | ICD-10-CM | POA: Diagnosis not present

## 2014-12-04 DIAGNOSIS — R0600 Dyspnea, unspecified: Secondary | ICD-10-CM | POA: Diagnosis not present

## 2014-12-04 DIAGNOSIS — R319 Hematuria, unspecified: Secondary | ICD-10-CM | POA: Diagnosis not present

## 2014-12-04 DIAGNOSIS — Z79899 Other long term (current) drug therapy: Secondary | ICD-10-CM | POA: Diagnosis not present

## 2014-12-04 DIAGNOSIS — D701 Agranulocytosis secondary to cancer chemotherapy: Secondary | ICD-10-CM | POA: Diagnosis not present

## 2014-12-04 DIAGNOSIS — F17210 Nicotine dependence, cigarettes, uncomplicated: Secondary | ICD-10-CM | POA: Diagnosis not present

## 2014-12-04 DIAGNOSIS — Z87440 Personal history of urinary (tract) infections: Secondary | ICD-10-CM | POA: Diagnosis not present

## 2014-12-04 DIAGNOSIS — I10 Essential (primary) hypertension: Secondary | ICD-10-CM | POA: Diagnosis not present

## 2014-12-04 DIAGNOSIS — R5383 Other fatigue: Secondary | ICD-10-CM | POA: Diagnosis not present

## 2014-12-04 DIAGNOSIS — N2889 Other specified disorders of kidney and ureter: Secondary | ICD-10-CM | POA: Diagnosis not present

## 2014-12-04 DIAGNOSIS — R531 Weakness: Secondary | ICD-10-CM | POA: Diagnosis not present

## 2014-12-04 DIAGNOSIS — D4621 Refractory anemia with excess of blasts 1: Secondary | ICD-10-CM | POA: Diagnosis not present

## 2014-12-04 DIAGNOSIS — Z86718 Personal history of other venous thrombosis and embolism: Secondary | ICD-10-CM | POA: Diagnosis not present

## 2014-12-04 DIAGNOSIS — E119 Type 2 diabetes mellitus without complications: Secondary | ICD-10-CM | POA: Diagnosis not present

## 2014-12-04 DIAGNOSIS — T451X5S Adverse effect of antineoplastic and immunosuppressive drugs, sequela: Secondary | ICD-10-CM | POA: Diagnosis not present

## 2014-12-04 DIAGNOSIS — Z8614 Personal history of Methicillin resistant Staphylococcus aureus infection: Secondary | ICD-10-CM | POA: Diagnosis not present

## 2014-12-04 DIAGNOSIS — D469 Myelodysplastic syndrome, unspecified: Secondary | ICD-10-CM | POA: Diagnosis not present

## 2014-12-07 DIAGNOSIS — Z79899 Other long term (current) drug therapy: Secondary | ICD-10-CM | POA: Diagnosis not present

## 2014-12-07 DIAGNOSIS — D4621 Refractory anemia with excess of blasts 1: Secondary | ICD-10-CM | POA: Diagnosis not present

## 2014-12-07 DIAGNOSIS — Z7982 Long term (current) use of aspirin: Secondary | ICD-10-CM | POA: Diagnosis not present

## 2014-12-07 DIAGNOSIS — D696 Thrombocytopenia, unspecified: Secondary | ICD-10-CM | POA: Diagnosis not present

## 2014-12-07 DIAGNOSIS — E119 Type 2 diabetes mellitus without complications: Secondary | ICD-10-CM | POA: Diagnosis not present

## 2014-12-07 DIAGNOSIS — D469 Myelodysplastic syndrome, unspecified: Secondary | ICD-10-CM | POA: Diagnosis not present

## 2014-12-07 DIAGNOSIS — T451X5S Adverse effect of antineoplastic and immunosuppressive drugs, sequela: Secondary | ICD-10-CM | POA: Diagnosis not present

## 2014-12-07 DIAGNOSIS — Z8614 Personal history of Methicillin resistant Staphylococcus aureus infection: Secondary | ICD-10-CM | POA: Diagnosis not present

## 2014-12-07 DIAGNOSIS — R0600 Dyspnea, unspecified: Secondary | ICD-10-CM | POA: Diagnosis not present

## 2014-12-07 DIAGNOSIS — F17210 Nicotine dependence, cigarettes, uncomplicated: Secondary | ICD-10-CM | POA: Diagnosis not present

## 2014-12-07 DIAGNOSIS — R319 Hematuria, unspecified: Secondary | ICD-10-CM | POA: Diagnosis not present

## 2014-12-07 DIAGNOSIS — R531 Weakness: Secondary | ICD-10-CM | POA: Diagnosis not present

## 2014-12-07 DIAGNOSIS — Z5111 Encounter for antineoplastic chemotherapy: Secondary | ICD-10-CM | POA: Diagnosis not present

## 2014-12-07 DIAGNOSIS — I739 Peripheral vascular disease, unspecified: Secondary | ICD-10-CM | POA: Diagnosis not present

## 2014-12-07 DIAGNOSIS — R5383 Other fatigue: Secondary | ICD-10-CM | POA: Diagnosis not present

## 2014-12-07 DIAGNOSIS — N2889 Other specified disorders of kidney and ureter: Secondary | ICD-10-CM | POA: Diagnosis not present

## 2014-12-07 DIAGNOSIS — I10 Essential (primary) hypertension: Secondary | ICD-10-CM | POA: Diagnosis not present

## 2014-12-07 DIAGNOSIS — Z87440 Personal history of urinary (tract) infections: Secondary | ICD-10-CM | POA: Diagnosis not present

## 2014-12-07 DIAGNOSIS — D701 Agranulocytosis secondary to cancer chemotherapy: Secondary | ICD-10-CM | POA: Diagnosis not present

## 2014-12-07 DIAGNOSIS — Z86718 Personal history of other venous thrombosis and embolism: Secondary | ICD-10-CM | POA: Diagnosis not present

## 2014-12-07 LAB — CREATININE, SERUM
Creatinine: 1.3 mg/dL (ref 0.60–1.30)
EGFR (African American): >60
EGFR (Non-African Amer.): 58 — ABNORMAL LOW

## 2014-12-07 LAB — CBC CANCER CENTER
BASOS ABS: 0 x10 3/mm (ref 0.0–0.1)
BASOS PCT: 0.2 %
Bands: 5 %
EOS ABS: 0 x10 3/mm (ref 0.0–0.7)
Eosinophil %: 0.7 %
HCT: 13.6 % — CL (ref 40.0–52.0)
HGB: 4.6 g/dL — CL (ref 13.0–18.0)
LYMPHS PCT: 33 %
LYMPHS PCT: 33.6 %
Lymphocyte #: 0.8 x10 3/mm — ABNORMAL LOW (ref 1.0–3.6)
MCH: 29.1 pg (ref 26.0–34.0)
MCHC: 33.9 g/dL (ref 32.0–36.0)
MCV: 86 fL (ref 80–100)
MONOS PCT: 6.9 %
Metamyelocyte: 5 %
Monocyte #: 0.2 x10 3/mm (ref 0.2–1.0)
Monocytes: 7 %
Myelocyte: 9 %
NEUTROS PCT: 58.6 %
Neutrophil #: 1.4 x10 3/mm (ref 1.4–6.5)
Other Cells Blood: 16 %
Platelet: 22 x10 3/mm — CL (ref 150–440)
Promyelocyte: 3 %
RBC: 1.58 10*6/uL — ABNORMAL LOW (ref 4.40–5.90)
RDW: 15 % — AB (ref 11.5–14.5)
SEGMENTED NEUTROPHILS: 14 %
Variant Lymphocyte: 8 %
WBC: 2.4 x10 3/mm — ABNORMAL LOW (ref 3.8–10.6)

## 2014-12-08 DIAGNOSIS — Z87440 Personal history of urinary (tract) infections: Secondary | ICD-10-CM | POA: Diagnosis not present

## 2014-12-08 DIAGNOSIS — T451X5S Adverse effect of antineoplastic and immunosuppressive drugs, sequela: Secondary | ICD-10-CM | POA: Diagnosis not present

## 2014-12-08 DIAGNOSIS — R319 Hematuria, unspecified: Secondary | ICD-10-CM | POA: Diagnosis not present

## 2014-12-08 DIAGNOSIS — Z79899 Other long term (current) drug therapy: Secondary | ICD-10-CM | POA: Diagnosis not present

## 2014-12-08 DIAGNOSIS — D4621 Refractory anemia with excess of blasts 1: Secondary | ICD-10-CM | POA: Diagnosis not present

## 2014-12-08 DIAGNOSIS — R0600 Dyspnea, unspecified: Secondary | ICD-10-CM | POA: Diagnosis not present

## 2014-12-08 DIAGNOSIS — D469 Myelodysplastic syndrome, unspecified: Secondary | ICD-10-CM | POA: Diagnosis not present

## 2014-12-08 DIAGNOSIS — I10 Essential (primary) hypertension: Secondary | ICD-10-CM | POA: Diagnosis not present

## 2014-12-08 DIAGNOSIS — D701 Agranulocytosis secondary to cancer chemotherapy: Secondary | ICD-10-CM | POA: Diagnosis not present

## 2014-12-08 DIAGNOSIS — Z7982 Long term (current) use of aspirin: Secondary | ICD-10-CM | POA: Diagnosis not present

## 2014-12-08 DIAGNOSIS — D696 Thrombocytopenia, unspecified: Secondary | ICD-10-CM | POA: Diagnosis not present

## 2014-12-08 DIAGNOSIS — Z86718 Personal history of other venous thrombosis and embolism: Secondary | ICD-10-CM | POA: Diagnosis not present

## 2014-12-08 DIAGNOSIS — Z8614 Personal history of Methicillin resistant Staphylococcus aureus infection: Secondary | ICD-10-CM | POA: Diagnosis not present

## 2014-12-08 DIAGNOSIS — N2889 Other specified disorders of kidney and ureter: Secondary | ICD-10-CM | POA: Diagnosis not present

## 2014-12-08 DIAGNOSIS — R5383 Other fatigue: Secondary | ICD-10-CM | POA: Diagnosis not present

## 2014-12-08 DIAGNOSIS — F17210 Nicotine dependence, cigarettes, uncomplicated: Secondary | ICD-10-CM | POA: Diagnosis not present

## 2014-12-08 DIAGNOSIS — R531 Weakness: Secondary | ICD-10-CM | POA: Diagnosis not present

## 2014-12-08 DIAGNOSIS — I739 Peripheral vascular disease, unspecified: Secondary | ICD-10-CM | POA: Diagnosis not present

## 2014-12-08 DIAGNOSIS — Z5111 Encounter for antineoplastic chemotherapy: Secondary | ICD-10-CM | POA: Diagnosis not present

## 2014-12-08 DIAGNOSIS — E119 Type 2 diabetes mellitus without complications: Secondary | ICD-10-CM | POA: Diagnosis not present

## 2014-12-09 DIAGNOSIS — Z87440 Personal history of urinary (tract) infections: Secondary | ICD-10-CM | POA: Diagnosis not present

## 2014-12-09 DIAGNOSIS — R531 Weakness: Secondary | ICD-10-CM | POA: Diagnosis not present

## 2014-12-09 DIAGNOSIS — Z86718 Personal history of other venous thrombosis and embolism: Secondary | ICD-10-CM | POA: Diagnosis not present

## 2014-12-09 DIAGNOSIS — I10 Essential (primary) hypertension: Secondary | ICD-10-CM | POA: Diagnosis not present

## 2014-12-09 DIAGNOSIS — Z8614 Personal history of Methicillin resistant Staphylococcus aureus infection: Secondary | ICD-10-CM | POA: Diagnosis not present

## 2014-12-09 DIAGNOSIS — F17210 Nicotine dependence, cigarettes, uncomplicated: Secondary | ICD-10-CM | POA: Diagnosis not present

## 2014-12-09 DIAGNOSIS — Z5111 Encounter for antineoplastic chemotherapy: Secondary | ICD-10-CM | POA: Diagnosis not present

## 2014-12-09 DIAGNOSIS — Z79899 Other long term (current) drug therapy: Secondary | ICD-10-CM | POA: Diagnosis not present

## 2014-12-09 DIAGNOSIS — I739 Peripheral vascular disease, unspecified: Secondary | ICD-10-CM | POA: Diagnosis not present

## 2014-12-09 DIAGNOSIS — D469 Myelodysplastic syndrome, unspecified: Secondary | ICD-10-CM | POA: Diagnosis not present

## 2014-12-09 DIAGNOSIS — R0600 Dyspnea, unspecified: Secondary | ICD-10-CM | POA: Diagnosis not present

## 2014-12-09 DIAGNOSIS — D4621 Refractory anemia with excess of blasts 1: Secondary | ICD-10-CM | POA: Diagnosis not present

## 2014-12-09 DIAGNOSIS — R5383 Other fatigue: Secondary | ICD-10-CM | POA: Diagnosis not present

## 2014-12-09 DIAGNOSIS — D696 Thrombocytopenia, unspecified: Secondary | ICD-10-CM | POA: Diagnosis not present

## 2014-12-09 DIAGNOSIS — D701 Agranulocytosis secondary to cancer chemotherapy: Secondary | ICD-10-CM | POA: Diagnosis not present

## 2014-12-09 DIAGNOSIS — Z7982 Long term (current) use of aspirin: Secondary | ICD-10-CM | POA: Diagnosis not present

## 2014-12-09 DIAGNOSIS — T451X5S Adverse effect of antineoplastic and immunosuppressive drugs, sequela: Secondary | ICD-10-CM | POA: Diagnosis not present

## 2014-12-09 DIAGNOSIS — R319 Hematuria, unspecified: Secondary | ICD-10-CM | POA: Diagnosis not present

## 2014-12-09 DIAGNOSIS — N2889 Other specified disorders of kidney and ureter: Secondary | ICD-10-CM | POA: Diagnosis not present

## 2014-12-09 DIAGNOSIS — E119 Type 2 diabetes mellitus without complications: Secondary | ICD-10-CM | POA: Diagnosis not present

## 2014-12-09 LAB — CBC CANCER CENTER
BASOS ABS: 0 x10 3/mm (ref 0.0–0.1)
BASOS PCT: 0.8 %
Eosinophil #: 0 x10 3/mm (ref 0.0–0.7)
Eosinophil %: 1.4 %
HCT: 19.7 % — AB (ref 40.0–52.0)
HGB: 6.6 g/dL — AB (ref 13.0–18.0)
Lymphocyte #: 0.4 x10 3/mm — ABNORMAL LOW (ref 1.0–3.6)
Lymphocyte %: 58.5 %
MCH: 28.8 pg (ref 26.0–34.0)
MCHC: 33.8 g/dL (ref 32.0–36.0)
MCV: 85 fL (ref 80–100)
MONOS PCT: 0.7 %
Monocyte #: 0 x10 3/mm — ABNORMAL LOW (ref 0.2–1.0)
NEUTROS ABS: 0.3 x10 3/mm — AB (ref 1.4–6.5)
Neutrophil %: 38.6 %
Platelet: 25 x10 3/mm — CL (ref 150–440)
RBC: 2.31 10*6/uL — AB (ref 4.40–5.90)
RDW: 14.3 % (ref 11.5–14.5)
WBC: 0.7 x10 3/mm — CL (ref 3.8–10.6)

## 2014-12-14 DIAGNOSIS — D469 Myelodysplastic syndrome, unspecified: Secondary | ICD-10-CM | POA: Diagnosis not present

## 2014-12-14 DIAGNOSIS — I739 Peripheral vascular disease, unspecified: Secondary | ICD-10-CM | POA: Diagnosis not present

## 2014-12-14 DIAGNOSIS — Z86718 Personal history of other venous thrombosis and embolism: Secondary | ICD-10-CM | POA: Diagnosis not present

## 2014-12-14 DIAGNOSIS — D696 Thrombocytopenia, unspecified: Secondary | ICD-10-CM | POA: Diagnosis not present

## 2014-12-14 DIAGNOSIS — R319 Hematuria, unspecified: Secondary | ICD-10-CM | POA: Diagnosis not present

## 2014-12-14 DIAGNOSIS — E119 Type 2 diabetes mellitus without complications: Secondary | ICD-10-CM | POA: Diagnosis not present

## 2014-12-14 DIAGNOSIS — R531 Weakness: Secondary | ICD-10-CM | POA: Diagnosis not present

## 2014-12-14 DIAGNOSIS — N2889 Other specified disorders of kidney and ureter: Secondary | ICD-10-CM | POA: Diagnosis not present

## 2014-12-14 DIAGNOSIS — Z5111 Encounter for antineoplastic chemotherapy: Secondary | ICD-10-CM | POA: Diagnosis not present

## 2014-12-14 DIAGNOSIS — D4621 Refractory anemia with excess of blasts 1: Secondary | ICD-10-CM | POA: Diagnosis not present

## 2014-12-14 DIAGNOSIS — T451X5S Adverse effect of antineoplastic and immunosuppressive drugs, sequela: Secondary | ICD-10-CM | POA: Diagnosis not present

## 2014-12-14 DIAGNOSIS — R0600 Dyspnea, unspecified: Secondary | ICD-10-CM | POA: Diagnosis not present

## 2014-12-14 DIAGNOSIS — Z79899 Other long term (current) drug therapy: Secondary | ICD-10-CM | POA: Diagnosis not present

## 2014-12-14 DIAGNOSIS — D701 Agranulocytosis secondary to cancer chemotherapy: Secondary | ICD-10-CM | POA: Diagnosis not present

## 2014-12-14 DIAGNOSIS — F17210 Nicotine dependence, cigarettes, uncomplicated: Secondary | ICD-10-CM | POA: Diagnosis not present

## 2014-12-14 DIAGNOSIS — R5383 Other fatigue: Secondary | ICD-10-CM | POA: Diagnosis not present

## 2014-12-14 DIAGNOSIS — Z8614 Personal history of Methicillin resistant Staphylococcus aureus infection: Secondary | ICD-10-CM | POA: Diagnosis not present

## 2014-12-14 DIAGNOSIS — Z87440 Personal history of urinary (tract) infections: Secondary | ICD-10-CM | POA: Diagnosis not present

## 2014-12-14 DIAGNOSIS — I10 Essential (primary) hypertension: Secondary | ICD-10-CM | POA: Diagnosis not present

## 2014-12-14 DIAGNOSIS — Z7982 Long term (current) use of aspirin: Secondary | ICD-10-CM | POA: Diagnosis not present

## 2014-12-14 LAB — CBC CANCER CENTER
BASOS ABS: 0 x10 3/mm (ref 0.0–0.1)
BASOS PCT: 0 %
Eosinophil #: 0 x10 3/mm (ref 0.0–0.7)
Eosinophil %: 0.1 %
HCT: 18.5 % — ABNORMAL LOW (ref 40.0–52.0)
HGB: 6.2 g/dL — ABNORMAL LOW (ref 13.0–18.0)
LYMPHS PCT: 77.3 %
Lymphocyte #: 0.2 x10 3/mm — ABNORMAL LOW (ref 1.0–3.6)
MCH: 28.9 pg (ref 26.0–34.0)
MCHC: 33.3 g/dL (ref 32.0–36.0)
MCV: 87 fL (ref 80–100)
Monocyte #: 0 x10 3/mm — ABNORMAL LOW (ref 0.2–1.0)
Monocyte %: 3.9 %
NEUTROS PCT: 18.7 %
Neutrophil #: 0.1 x10 3/mm — ABNORMAL LOW (ref 1.4–6.5)
PLATELETS: 27 x10 3/mm — AB (ref 150–440)
RBC: 2.13 10*6/uL — AB (ref 4.40–5.90)
RDW: 14.4 % (ref 11.5–14.5)
WBC: 0.3 x10 3/mm — AB (ref 3.8–10.6)

## 2014-12-17 DIAGNOSIS — Z5111 Encounter for antineoplastic chemotherapy: Secondary | ICD-10-CM | POA: Diagnosis not present

## 2014-12-17 DIAGNOSIS — F17210 Nicotine dependence, cigarettes, uncomplicated: Secondary | ICD-10-CM | POA: Diagnosis not present

## 2014-12-17 DIAGNOSIS — R0600 Dyspnea, unspecified: Secondary | ICD-10-CM | POA: Diagnosis not present

## 2014-12-17 DIAGNOSIS — Z7982 Long term (current) use of aspirin: Secondary | ICD-10-CM | POA: Diagnosis not present

## 2014-12-17 DIAGNOSIS — I739 Peripheral vascular disease, unspecified: Secondary | ICD-10-CM | POA: Diagnosis not present

## 2014-12-17 DIAGNOSIS — D696 Thrombocytopenia, unspecified: Secondary | ICD-10-CM | POA: Diagnosis not present

## 2014-12-17 DIAGNOSIS — R5383 Other fatigue: Secondary | ICD-10-CM | POA: Diagnosis not present

## 2014-12-17 DIAGNOSIS — I10 Essential (primary) hypertension: Secondary | ICD-10-CM | POA: Diagnosis not present

## 2014-12-17 DIAGNOSIS — T451X5S Adverse effect of antineoplastic and immunosuppressive drugs, sequela: Secondary | ICD-10-CM | POA: Diagnosis not present

## 2014-12-17 DIAGNOSIS — R531 Weakness: Secondary | ICD-10-CM | POA: Diagnosis not present

## 2014-12-17 DIAGNOSIS — D469 Myelodysplastic syndrome, unspecified: Secondary | ICD-10-CM | POA: Diagnosis not present

## 2014-12-17 DIAGNOSIS — N2889 Other specified disorders of kidney and ureter: Secondary | ICD-10-CM | POA: Diagnosis not present

## 2014-12-17 DIAGNOSIS — R319 Hematuria, unspecified: Secondary | ICD-10-CM | POA: Diagnosis not present

## 2014-12-17 DIAGNOSIS — Z8614 Personal history of Methicillin resistant Staphylococcus aureus infection: Secondary | ICD-10-CM | POA: Diagnosis not present

## 2014-12-17 DIAGNOSIS — D701 Agranulocytosis secondary to cancer chemotherapy: Secondary | ICD-10-CM | POA: Diagnosis not present

## 2014-12-17 DIAGNOSIS — Z86718 Personal history of other venous thrombosis and embolism: Secondary | ICD-10-CM | POA: Diagnosis not present

## 2014-12-17 DIAGNOSIS — Z87440 Personal history of urinary (tract) infections: Secondary | ICD-10-CM | POA: Diagnosis not present

## 2014-12-17 DIAGNOSIS — E119 Type 2 diabetes mellitus without complications: Secondary | ICD-10-CM | POA: Diagnosis not present

## 2014-12-17 DIAGNOSIS — D4621 Refractory anemia with excess of blasts 1: Secondary | ICD-10-CM | POA: Diagnosis not present

## 2014-12-17 DIAGNOSIS — Z79899 Other long term (current) drug therapy: Secondary | ICD-10-CM | POA: Diagnosis not present

## 2014-12-17 LAB — CBC CANCER CENTER
BASOS PCT: 0 %
Basophil #: 0 x10 3/mm (ref 0.0–0.1)
Eosinophil #: 0 x10 3/mm (ref 0.0–0.7)
Eosinophil %: 0.2 %
HCT: 12.4 % — AB (ref 40.0–52.0)
HGB: 4.1 g/dL — AB (ref 13.0–18.0)
LYMPHS ABS: 0.2 x10 3/mm — AB (ref 1.0–3.6)
LYMPHS PCT: 86.9 %
MCH: 28.6 pg (ref 26.0–34.0)
MCHC: 33.1 g/dL (ref 32.0–36.0)
MCV: 86 fL (ref 80–100)
MONOS PCT: 1.9 %
Monocyte #: 0 x10 3/mm — ABNORMAL LOW (ref 0.2–1.0)
NEUTROS ABS: 0 x10 3/mm — AB (ref 1.4–6.5)
Neutrophil %: 11 %
Platelet: 34 x10 3/mm — ABNORMAL LOW (ref 150–440)
RBC: 1.44 10*6/uL — ABNORMAL LOW (ref 4.40–5.90)
RDW: 14.3 % (ref 11.5–14.5)
WBC: 0.3 x10 3/mm — AB (ref 3.8–10.6)

## 2014-12-18 DIAGNOSIS — Z86718 Personal history of other venous thrombosis and embolism: Secondary | ICD-10-CM | POA: Diagnosis not present

## 2014-12-18 DIAGNOSIS — E119 Type 2 diabetes mellitus without complications: Secondary | ICD-10-CM | POA: Diagnosis not present

## 2014-12-18 DIAGNOSIS — D469 Myelodysplastic syndrome, unspecified: Secondary | ICD-10-CM | POA: Diagnosis not present

## 2014-12-18 DIAGNOSIS — R5383 Other fatigue: Secondary | ICD-10-CM | POA: Diagnosis not present

## 2014-12-18 DIAGNOSIS — Z79899 Other long term (current) drug therapy: Secondary | ICD-10-CM | POA: Diagnosis not present

## 2014-12-18 DIAGNOSIS — D701 Agranulocytosis secondary to cancer chemotherapy: Secondary | ICD-10-CM | POA: Diagnosis not present

## 2014-12-18 DIAGNOSIS — Z7982 Long term (current) use of aspirin: Secondary | ICD-10-CM | POA: Diagnosis not present

## 2014-12-18 DIAGNOSIS — Z87440 Personal history of urinary (tract) infections: Secondary | ICD-10-CM | POA: Diagnosis not present

## 2014-12-18 DIAGNOSIS — I10 Essential (primary) hypertension: Secondary | ICD-10-CM | POA: Diagnosis not present

## 2014-12-18 DIAGNOSIS — Z5111 Encounter for antineoplastic chemotherapy: Secondary | ICD-10-CM | POA: Diagnosis not present

## 2014-12-18 DIAGNOSIS — D4621 Refractory anemia with excess of blasts 1: Secondary | ICD-10-CM | POA: Diagnosis not present

## 2014-12-18 DIAGNOSIS — R0600 Dyspnea, unspecified: Secondary | ICD-10-CM | POA: Diagnosis not present

## 2014-12-18 DIAGNOSIS — N2889 Other specified disorders of kidney and ureter: Secondary | ICD-10-CM | POA: Diagnosis not present

## 2014-12-18 DIAGNOSIS — Z8614 Personal history of Methicillin resistant Staphylococcus aureus infection: Secondary | ICD-10-CM | POA: Diagnosis not present

## 2014-12-18 DIAGNOSIS — I739 Peripheral vascular disease, unspecified: Secondary | ICD-10-CM | POA: Diagnosis not present

## 2014-12-18 DIAGNOSIS — F17210 Nicotine dependence, cigarettes, uncomplicated: Secondary | ICD-10-CM | POA: Diagnosis not present

## 2014-12-18 DIAGNOSIS — R319 Hematuria, unspecified: Secondary | ICD-10-CM | POA: Diagnosis not present

## 2014-12-18 DIAGNOSIS — D696 Thrombocytopenia, unspecified: Secondary | ICD-10-CM | POA: Diagnosis not present

## 2014-12-18 DIAGNOSIS — R531 Weakness: Secondary | ICD-10-CM | POA: Diagnosis not present

## 2014-12-18 DIAGNOSIS — T451X5S Adverse effect of antineoplastic and immunosuppressive drugs, sequela: Secondary | ICD-10-CM | POA: Diagnosis not present

## 2015-01-04 ENCOUNTER — Ambulatory Visit: Payer: Self-pay | Admitting: Internal Medicine

## 2015-01-04 DEATH — deceased

## 2015-03-26 NOTE — Op Note (Signed)
PATIENT NAME:  Alan Reed, Alan Reed MR#:  [id number] DATE OF BIRTH:  July 09, 1946  DATE OF PROCEDURE:  01/13/2013  PREOPERATIVE DIAGNOSES:  1.  Peripheral arterial disease with rest pain, left lower extremity.  2.  Status post previous intervention at outside institution.   3.  Hypertension.  4.  Diabetes.  5. Subacute on chronic ischemia with thrombosis of his previous left lower extremity interventions.    POSTOPERATIVE DIAGNOSES:   1.  Peripheral arterial disease with rest pain, left lower extremity.  2.  Status post previous intervention at outside institution.   3.  Hypertension.  4.  Diabetes.  5. Subacute on chronic ischemia with thrombosis of his previous left lower extremity interventions.    PROCEDURES PERFORMED: 1.  Ultrasound guidance for vascular access, right femoral artery.  2.  Catheter placement into left popliteal artery from right femoral approach.  3.  Aortogram and selective left lower extremity angiogram.  4.  Percutaneous transluminal angioplasty left common femoral artery, superficial femoral artery and popliteal artery with 6 mm and 7 mm diameter angioplasty balloon.  5.  Catheter directed thrombolysis with 4 mg of TPA to same vessels.  6.  Mechanical rheolytic thrombectomy with AngioJet Omni catheter to the same vessels.  7.  Covered stent placement to the distal left superficial femoral artery and most proximal above-knee popliteal artery with 5-0 stent for greater than 50% residual stenosis and thrombosis after previous interventions.  8.  StarClose closure device for right femoral artery.   SURGEON: Algernon Huxley, M.D.   ANESTHESIA: Local with moderate conscious sedation.   ESTIMATED BLOOD LOSS: 25 mL.   INDICATION FOR PROCEDURE: This is a 69 year old male who was referred to me for evaluation for his peripheral vascular disease. He had previously had a history of peripheral vascular disease and is status post intervention at an outside institution last year.   Over the past several weeks, his symptoms dramatically worsened from some claudication of left leg to ischemic rest pain of the left lower extremity. His noninvasive study showed markedly reduced ABI and occlusion of his previous interventions, in our office last week. He was brought in for evaluation for revascularization. The fact that this has now rest pain demonstrated a critical limb situation. Apparently he was previously intervened upon for claudication. Angiography is performed and risks and benefits were discussed. Informed consent was obtained.   DESCRIPTION OF PROCEDURE: The patient is brought to the vascular interventional radiology suite. Groin is  shaved and prepped and a sterile surgical field was created. Ultrasound was used to visualize a patent right femoral artery and this was accessed under direct ultrasound guidance without difficulty with a Seldinger needle and permanent image was recorded. A J-wire and 5 French sheath were placed. Pigtail catheter was placed in the aorta at the L1 level and AP aortogram was performed. This showed 2 renal arteries on the left with the lower left renal artery coming off the distal aorta and the more superior renal artery coming off the typical location in the right renal artery was single. The aorta had atherosclerotic irregularity without stenosis. There was a patent right iliac stent without stenosis and the left iliac artery had mild stenosis in the 30% to 40% range in the common iliac artery. I then took the bifurcation and advanced to the left femoral head and selective left lower extremity angiogram was then performed. This demonstrated occlusion of the left SFA stent. The left SFA stent jailed the profunda femoris artery origin. There appeared  to be 2 profunda femoris branches; the main profunda femoris artery being lower and the stent definitely overhung, likely making his collateralization more poor when he occluded the stent. He reconstituted an  above-knee popliteal artery and appeared to have 2-vessel runoff distally, although this was quite poorly opacified and not well seen initially. The patient was systemically heparinized. A 6 French Ansel sheath was placed over a Terumo advantage wire. I was able to cross the lesion without difficulty. Balloon angioplasty was performed with a 6 mm diameter angioplasty balloon from the above-knee popliteal artery up into the common femoral artery. Following this, there was still occlusion of the stent with clear evidence of thrombus present. There was some more flow into the stent then had been previously seen. I then laced the thrombus with 4 mg of TPA with the AngioJet Omni catheter from the common femoral artery down to the popliteal artery. This was allowed to dwell for 15 minutes. Mechanical rheolytic thrombectomy was then performed in the common femoral artery, superficial femoral artery and popliteal artery with significant improvement, but not resolution. The proximal SFA to the common femoral artery were treated with a 7 mm diameter angioplasty balloon as was the distal end of the stent. Following that, the proximal end of the stent, the common femoral artery had markedly improved flow. The stent was patent until its distal portion, where there were significant residual stenosis and thrombus that was in the 70% to 80% range. I elected to cover this with a 6 mm diameter Viabahn stent. This was deployed and ironed out with a 7 mm balloon with improvement in flow. There was thrombus present distally in the tibial vessels and his distal opacification remained reasonably poor on our initial imaging. I elected to run Integrilin and terminate procedure. We will plan on keeping the patient on anticoagulation chronically. The sheath was removed. A StarClose closure device was deployed in the usual fashion with excellent hemostatic result. The patient tolerated the procedure well and was taken to the recovery room in  stable condition.    ____________________________ Algernon Huxley, MD jsd:cc D: 01/13/2013 14:24:42 ET T: 01/13/2013 17:39:17 ET JOB#: [id number]  cc: Algernon Huxley, MD, <Dictator> Algernon Huxley MD ELECTRONICALLY SIGNED 01/20/2013 16:57

## 2015-03-26 NOTE — Op Note (Signed)
PATIENT NAME:  Alan Reed, Alan Reed MR#:  [id number] DATE OF BIRTH:  08/29/1946  DATE OF PROCEDURE:  01/30/2013  PREOPERATIVE DIAGNOSES:  1.  Gangrene, left foot.  2.  Severe peripheral vascular disease, status post previous interventions and bypass with persistent ischemic rest pain and gangrenous changes to left foot.  3.  Tobacco dependence.  4.  Diabetes.  5.  Heparin antibody.   POSTOPERATIVE DIAGNOSES:  1.  Gangrene, left foot.  2.  Severe peripheral vascular disease, status post previous interventions and bypass with persistent ischemic rest pain and gangrenous changes to left foot.  3.  Tobacco dependence.  4.  Diabetes.  5.  Heparin antibody.   PROCEDURE:   1.  Femoral artery-popliteal vein bypass graft with repair of femoral artery defect with CorMatrix patch.  2.  Left above-knee amputation.   SURGEON: Algernon Huxley, M.D.   ANESTHESIA: General.   ESTIMATED BLOOD LOSS: Approximately 200 mL.   INDICATION FOR PROCEDURE: This is a 69 year old white male who is in the hospital. He had a fem-pop bypass 3 days ago for persistent left lower extremity ischemia. He had had initial revascularization at an outside institution. This thrombosed and he was in severe ischemic rest pain. We performed a repeat revascularization several weeks ago and this thrombosed as well. His initial revascularization at the outside institution included placement of a superficial femoral artery stent, obstructing flow in the profunda femoris artery, making the situation quite difficult to salvage at this point. After the femoral to popliteal bypass graft, his left foot remained cool and mottled. He was started on a heparin drip and his foot actually worsened. This was stopped within 24 hours and a heparin antibody was sent off. This was found to be positive. At this point, he had gangrenous changes to his foot with severe pain and diminished neurologic function. His foot at this time was not salvageable. He will  require an amputation for this. We will remove his prosthetic bypass graft and patch the artery open, also ensure to remove clot from the femoral artery and profunda femoris artery if present. The patient and his wife were agreeable with our plan of care and risks and benefits were discussed.   DESCRIPTION OF PROCEDURE: The patient was brought to the operative suite and after an adequate level of general anesthesia was obtained, the left lower extremity was sterilely prepped and draped and a sterile surgical field was created. I initially started by reopening the femoral incision and encircling the common femoral artery, superficial femoral artery, multiple profunda femoris artery branches and femoral artery branch with vessel loops. Prior to opening this up, I went ahead and performed the amputation so there would be less blood loss, as well as the fact that I wanted to anticoagulate the patient before opening the arteriotomy. I made a fishmouth incision just above the left knee. The muscle was taken with electrocautery. The femur was dissected out and periosteal elevator was used to help get the femur several centimeters cephalad to our incision. The bone was then transected with the oscillating saw. The posterior flap was completed with the amputation knife and all the nerve was controlled with a free-tie. Stick ties were used for the major vessels and electrocautery was used for smaller vessels. The wound was then irrigated. It was closed with 2 figure-of-eight 0 Vicryls in the deep tissue. The superficial fascia was closed with a series of interrupted figure-of-eight sutures 0 Vicryl sutures, and the subcutaneous tissue was closed with 2-0  Vicryl. The skin was coapted with staples. I then gave the patient a systemic dose of Angiomax. I pulled up control on the vessel loops. The femoral-popliteal bypass, which had been transected as part of our above-knee amputation, was pulled out up in the femoral incision  and removed in its entirety and taken off the artery so that no prosthetic would remain. There was clot in the femoral artery. I passed a 3 Fogarty through the profunda branches with return of excellent backbleeding. No significant thrombus was seen down into the profunda branches. There was excellent pulsatile inflow proximally. I then irrigated the wound with saline and used a CorMatrix extracellular patch for closure of the arterial defect. This was sewed in with a 6-0 Prolene suture started at the distal endpoint and run one-half the length of the arteriotomy. A second 6-0 Prolene suture was started at the proximal endpoint and the arteriotomy was closed. The vessel was flushed and de-aired prior to release of control. On release, there was good pulse within the femoral artery and the profunda femoris artery. The wound was then irrigated and closed with 2 layers of 2-0 Vicryl, 2 layers of 3-0 Vicryl and a 4-0 Monocryl. An incisional VAC was placed over the groin and a large bulky dressing was placed on the amputation site. The patient was awakened from anesthesia and taken to the recovery room in stable condition having tolerated the procedure well.    ____________________________ Algernon Huxley, MD jsd:jm D: 01/30/2013 17:39:06 ET T: 01/30/2013 21:11:36 ET JOB#: [id number]  cc: Algernon Huxley, MD, <Dictator> Isla Pence, MD Algernon Huxley MD ELECTRONICALLY SIGNED 02/06/2013 9:51

## 2015-03-26 NOTE — H&P (Signed)
PATIENT NAME:  Alan Reed, Alan Reed MR#:  [id number] DATE OF BIRTH:  1946/01/02  DATE OF ADMISSION:  02/25/2013  PRIMARY CARE PHYSICIAN: Isla Pence, MD   REFERRING PHYSICIAN: Gretchen Short. Beather Arbour, MD  CHIEF COMPLAINT: Abdominal pain.   HISTORY OF PRESENT ILLNESS: The patient is a 69 year old white male with a past medical history of severe peripheral vascular disease, history of heavy tobacco use, who was admitted to [facility] on 01/26/2013 when the patient started experiencing symptoms of ischemia in the left lower extremity. The patient has undergone multiple percutaneous interventions in order to open up the blood vessels. However, during that admission, the patient underwent popliteal-femoral bypass surgery. The patient was placed on heparin drip; however, the patient's ischemia continued to worsen. 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 The patient underwent AKA of the left leg and was placed on Xarelto. The patient was discharged to [facility] for rehabilitation. The patient states was doing well, improving himself with activity. However, yesterday, started to experience pain in the suprapubic area with the urge to void. Called the nurse at the home. Per family, the RN was initially resistant to send the patient to the nursing home. Called the EMS and was brought to the Emergency Department. In the Emergency Department, the patient had quite a distended bladder. Changed the Foley catheter with return of purulent urine of about 1 liter. The patient's pain significantly improved. The patient states was not feeling well 2 days prior to that, was experiencing fever and chills with a decreased appetite. The patient has been experiencing increased discoloration of the right toes. Per the patient's wife, they are significantly worse from the baseline. The patient had stent placement to the right lower extremity by Dr. Lucky Cowboy without much improvement and continued to  worsen. In the Emergency Department, there was also concern about wound dehiscence of the left AKA. The patient received vancomycin and Zosyn in the Emergency Department. Blood and urine cultures were obtained. During that hospital stay, the patient was also found to have urinary retention, attempt of 2 times reinserted back the Foley catheter. The patient was discharged to the nursing home with Foley catheter and also added tamsulosin to the regimen.   PAST MEDICAL HISTORY:  1. Peripheral arterial disease, status post left AKA, status post previous interventions to the right lower extremity at [facility].  2. Tobacco dependence.  3. Diabetes mellitus.  4. Hypertension.   ALLERGIES: HEPARIN.  HOME MEDICATIONS:  1. Xarelto 15 mg 2 times a day. 2. Wellbutrin 150 mg every 24 hours. 3. Trazodone 50 mg once a day.  4. Tamsulosin 0.4 mg once a day. 5. Plavix 75 mg 1 tablet once a day.  6. Percocet 10/325 mg 4 times a day.  7. Nicotine patch.  8. Metformin 500 mg once a day.  9. Lisinopril 5 mg once a day.  10. Lipitor 10 mg once a day.  11. Imodium as needed.  12. Glipizide 1 tablet once a day.  13. Colchicine 0.6 mg once a day.  14. Carvedilol 3.125 mg 2 times a day.  15. Alprazolam 0.25 mg every 8 hours as needed.   SOCIAL HISTORY: Smoked until the last admission heavily, 1 to 2 packs a day. Drank some amount of alcohol occasionally. Denies using any illicit drugs. Married, lives with his wife.   FAMILY HISTORY: Strong family history of peripheral vascular disease and heart problems.   REVIEW OF SYSTEMS:  CONSTITUTIONAL: Generalized fatigue,  weight loss.  EYES: No discharge or change in vision.  ENT: No sore throat. Ears: No ringing in the ears, decreased hearing.  RESPIRATORY: No cough, shortness of breath.  GASTROINTESTINAL: No nausea, vomiting.  HEMATOLOGY: No easy bruising or bleeding.  ENDOCRINE: No polyuria or polydipsia.  MUSCULOSKELETAL: The patient has left AKA, otherwise  has good range of motion in all the extremities. Right lower extremity with multiple gangrenous toes.  PSYCHIATRIC: Has been experiencing depression since the AKA.   PHYSICAL EXAMINATION:  GENERAL: This is a well-built, well-nourished age-appropriate male, lying down in the bed, not in distress.  VITAL SIGNS: Temperature 97.5, pulse 104, blood pressure 128/68, respiratory rate of 20, oxygen saturation is 97% on room air.  HEENT: Head normocephalic, atraumatic. Eyes: No scleral icterus. Conjunctivae normal. Pupils equal and reactive to light. Mucous membranes moist.  NECK: Supple. No lymphadenopathy. No JVD. No carotid bruit.  CHEST: Has no focal tenderness.  LUNGS: Bilaterally clear to auscultation.  HEART: S1, S2, regular. No murmurs are heard.  ABDOMEN: Bowel sounds present. Soft. Mild tenderness in the suprapubic area. No guarding or rebound tenderness. A Foley catheter in place, draining purulent urine.  EXTREMITIES: Left AKA wound is healing well per my view; however, Emergency Department physician stated that there is a dehiscence of wound with purulent drainage. I did not appreciate any. Right lower extremity has multiple gangrenous toes 1 to 4 with poor pulses in the right lower extremity  NEUROLOGIC: The patient is alert, oriented to place, person and time. No apparent cranial nerve abnormalities. Moving all extremities.   LABORATORIES:  1. CMP: Glucose 260, the rest of the values are within normal limits.  2. CBC: WBC of 16,000, hemoglobin 14 grams, platelet count of 345, neutrophils of 88%.   ASSESSMENT AND PLAN: The patient is a 69 year old male who comes to the Emergency Department with urinary retention and urinary tract infection and sepsis.   1. Sepsis secondary to urinary tract infection. Considering the patient's recent admission and multiple procedures, will keep the patient on vancomycin and Zosyn. Obtain urine and blood cultures.  2. Urinary tract infection. As mentioned  above, this is secondary to indwelling Foley catheter and clogged Foley catheter. Continue with antibiotics. De-escalate the antibiotics once culture data is available.  3. Urinary retention. As mentioned above, the patient has been experiencing urinary retention without Foley catheter. This is secondary to clogged Foley catheter. Continue with regular Foley care. The patient would benefit being evaluated by urology as an outpatient.  4. Severe peripheral vascular disease with recent above-knee amputation by Dr. Lucky Cowboy. Will consult Dr. Lucky Cowboy, especially concerning about right lower extremity gangrenous toes.  5. Hypertension, currently well controlled.  6. Diabetes mellitus, moderately controlled. Continue with sliding scale insulin.  7. Keep the patient on deep vein thrombosis prophylaxis with SCDs.  ____________________________ Monica Becton, MD pv:OSi D: 02/25/2013 07:37:42 ET T: 02/25/2013 08:14:46 ET JOB#: [id number]  cc: Monica Becton, MD, <Dictator> Monica Becton MD ELECTRONICALLY SIGNED 03/10/2013 8:17

## 2015-03-26 NOTE — Consult Note (Signed)
Brief Consult Note: Diagnosis: ASO with gangrene bilateral lower extremities, s/p left AKA.   Comments: will order dressing changes to left staple line, continue antibiotics  no treatment for the right foot at this time.  Electronic Signatures: Hortencia Pilar (MD)  (Signed 25-Mar-14 17:18)  Authored: Brief Consult Note   Last Updated: 25-Mar-14 17:18 by Hortencia Pilar (MD)

## 2015-03-26 NOTE — Discharge Summary (Signed)
PATIENT NAME:  Alan Reed, Alan Reed MR#:  [id number] DATE OF BIRTH:  1946/01/26  DATE OF ADMISSION:  01/27/2013 DATE OF DISCHARGE:  02/04/2013  ADMISSION DIAGNOSES:  1. Peripheral artery disease with ulceration, left lower extremity.  2. HEPARIN ALLERGY.  3. Tobacco dependence.  4. Urinary retention requiring replacement of Foley on two occasions.  5. Diabetes.  PROCEDURE PERFORMED: While in hospital are: 1. Left femoral endarterectomy and left femoral popliteal bypass on 01/27/2013.  2. Left above-knee amputation, removal of femoropopliteal bypass graft and femoral artery repair with CorMatrix on 01/30/2013.   BRIEF HISTORY: This is a 69 year old white male with advanced peripheral vascular disease. He is status post previous interventions at an outside institution as well as a repeat intervention here. Despite this, he has had thrombosis of his interventions and developed ischemic rest pain with progression to ulceration. He is brought in for an attempt at limb salvage with leg bypass initially.   HOSPITAL COURSE: The patient admitted to the hospital, taken to the operating room where a femoral-to-popliteal bypass graft was performed. Initially, his perfusion to his lower extremity seemed to be improved; however, over several hours that evening, his perfusion appeared to worsen. As would be typical with concern for perfusion, he started on anticoagulation in the form of a HEPARIN drip. With the HEPARIN drip, both lower extremities actually initially worsened. His HEPARIN drip was stopped within several hours and a send off laboratory for HEPARIN antibody was sent and this came back two days later as positive. He was started on Xarelto, but at this point he had fixed skin changes of his left foot, loss of neurologic function with severe pain, and was agreeable to proceed with amputation.  On postoperative day #3 from his initial surgery, he was taken back to the operating room where left above-knee  amputation was performed. His bypass graft was removed and his femoral artery was repaired with a CorMatrix patch. Following this, he did reasonably well. His biggest issue was urinary retention. He had the Foley removed on two occasions, but it had to be replaced and he was started on Flomax for this. His pain improved over several days. He was working with Physical Therapy. On postoperative day #5 from the second surgery, he was doing well and stable for discharge. He will be discharged to rehab facility. His return appointment will be in 2 to 3 weeks in our office. His activity will be as tolerated, increasing with Physical Therapy on a daily basis.   MEDICATIONS: Include atorvastatin 10 mg daily, alprazolam 0.25 mg q.8 hours as needed, Colcrys 0.6 mg daily, Percocet 10/325 q.6 hours p.r.n., carvedilol 3.125 mg b.i.d., Xarelto 15 mg b.i.d., lisinopril 5 mg daily, glipizide 5 mg daily, Plavix 75 mg daily, metformin 500 mg daily, nicotine patch 7 mg transdermal, and Flomax 0.4 mg daily.  DISCHARGE INSTRUCTIONS: They will call or contact office for any fevers, wound problems, drainage, increased pain, or other issues.    ____________________________ Algernon Huxley, MD jsd:es D: 02/04/2013 10:30:33 ET T: 02/04/2013 11:00:53 ET JOB#: [id number]  cc: Algernon Huxley, MD, <Dictator> Algernon Huxley MD ELECTRONICALLY SIGNED 02/06/2013 9:48

## 2015-03-26 NOTE — Discharge Summary (Signed)
PATIENT NAME:  Alan Reed, Alan Reed MR#:  [id number] DATE OF BIRTH:  July 05, 1946  DATE OF ADMISSION:  02/25/2013 DATE OF DISCHARGE:  02/26/2013   PRESENTING COMPLAINT: Abdominal pain.   DISCHARGE DIAGNOSES:  1.  Suspected sepsis secondary to urinary tract infection in the setting of chronic indwelling Foley catheter, resolved.  2.  Gram-negative urinary tract infection, on Cipro.  3.  Chronic indwelling Foley catheter for the past 1 month due to urinary retention and suspected benign prostatic hypertrophy.  4.  Severe peripheral vascular disease with recent above-knee amputation by Dr. Lucky Cowboy and left foot dry gangrene.  5.  Hypertension.  6.  Type 2 diabetes.   CONDITION ON DISCHARGE: Fair.   CODE STATUS: Full code.   INSTRUCTIONS:  1.  The patient is to follow up with Dr. Jacqlyn Larsen of urology for chronic Foley catheter and possible BPH on the set appointment.  2.  Follow up with Drs. Dew, Schnier, of vascular surgery as outpatient on your scheduled appointment.  3.  Diet: 2 gram sodium, ADA 1800 calorie.  4.  PT as tolerated.   MEDICATIONS AT DISCHARGE:  1.  Cipro 500 b.i.d. for 6 days.  2.  Aspirin 81 mg daily.  3.  Sliding scale insulin.  4.  Colace 100 mg b.i.d. p.r.n.  5.  Senna 1 tablet p.o. b.i.d. p.r.n.  6.  Zestril 5 mg daily.  7.  Tamsulosin 0.4 mg daily.  8.  Glipizide 5 mg daily.  9.  Colchicine 0.6 mg daily.  10.  Trazodone 50 mg at bedtime.  11.  Alprazolam 0.25 mg q.8 p.r.n.  12.  Atorvastatin 10 mg daily.  13.  Clopidogrel 75 mg daily.  14.  Coreg 6.25 b.i.d.  15.  Bupropion 150 mg q.24.  16.  Xarelto 15 mg b.i.d.  17.  Glucophage 500 mg b.i.d.  18.  Percocet 10/325 mg 1 tablet p.o. q.6 p.r.n. for pain.   CONSULTATIONS: Vascular surgery, Dr. Delana Meyer, recommends to continue dressing changes to left staple line. Continue antibiotics. No treatment for right foot at this time.   LABORATORY DATA: Blood cultures: Negative in 24 hours. Urine culture: Gram-negative rods. White  count is 9.4, hemoglobin and hematocrit 12.7 and 36.8, platelet count is 312. Basic metabolic panel within normal limits. Hemoglobin A1c is 6.7. Urinalysis positive for UTI.   BRIEF SUMMARY OF HOSPITAL COURSE: The patient is a 69 year old Caucasian gentleman with history of severe PVD, diabetes, hypertension, comes to the Emergency Room with urinary retention and possible sepsis. The patient was admitted with:  1.  Sepsis secondary to urinary tract infection in the setting of chronic indwelling Foley catheter. Given multiple procedures and admissions to the hospital, he was started on IV Zosyn and vancomycin. Blood cultures remained negative. Urine culture grew gram-negative rod, ID still pending. The patient remained afebrile. White count was stable. His sepsis resolved. He was changed to Cipro p.o. b.i.d. for 7 days.  2.  Urinary tract infection, as above, suspected due to chronic indwelling Foley catheter and catheter blockage. He did have some abdominal pain, which was resolved after catheter was changed and patient put out about 1000 mL. Antibiotics were changed to p.o. Cipro for 7 more days.  3.  Urinary retention with chronic Foley for past 1 month. The patient has not seen urology as outpatient. Given his chronic indwelling Foley catheter and history of possible BPH, the patient will be set up with Dr. Jacqlyn Larsen, who is listed to be on call for urology today on  the day of discharge. We will try to get an appointment prior to discharge.  4.  Severe peripheral vascular disease with recent left above-knee amputation by Dr. Lucky Cowboy. Dr. Delana Meyer saw patient and recommends no further workup on the right foot which has dry gangrene.  5.  Hypertension: Medications were continued.  6.  Diabetes: The patient was continued on his home medications and sliding scale insulin.  7.  Hospital stay otherwise remained stable. The patient remained a full code. The patient is going to be discharged to [facility]. Wife and  patient were agreeable to it.   TIME SPENT: 40 minutes.    ____________________________ Hart Rochester Posey Pronto, MD sap:jm D: 02/26/2013 13:24:15 ET T: 02/26/2013 13:58:20 ET JOB#: [id number]  cc: Vicent Febles A. Posey Pronto, MD, <Dictator> Algernon Huxley, MD Katha Cabal, MD Denice Bors. Jacqlyn Larsen, MD Ilda Basset MD ELECTRONICALLY SIGNED 03/07/2013 15:42

## 2015-03-26 NOTE — Op Note (Signed)
PATIENT NAME:  Alan Reed, Alan Reed MR#:  [id number] DATE OF BIRTH:  10-31-1946  DATE OF OPERATION:  02/17/2013  PREOPERATIVE DIAGNOSES: 1.  Peripheral arterial disease, status post left above-knee amputation.  2.  Gangrenous toes, right lower extremity.  3.  HEPARIN ALLERGY.   POSTOPERATIVE  DIAGNOSES:  1.  Peripheral arterial disease, status post left above-knee amputation.  2.  Gangrenous toes, right lower extremity.  3.  HEPARIN ALLERGY.   PROCEDURES: 1.  Ultrasound guidance for vascular access, right femoral artery.  2.  Right lower extremity angiogram from an antegrade approach.   SURGEON: Algernon Huxley, M.D.   ANESTHESIA: Local with moderate conscious sedation.   BLOOD LOSS: 25 mL.   INDICATION FOR PROCEDURE: This is a gentleman well known to me for his peripheral vascular disease and carotid disease. He had had previous interventions performed at an outside institution. These failed, and we performed further interventions and surgical therapy to the left lower extremity, but ultimately he developed a non-salvageable left leg and has had a left above-knee amputation within the past several weeks. As part of this, he, with thrombosis of his graft and his interventions, it was found that he had a HEPARIN ALLERGY. He has developed gangrene of several toes on the right foot, and we are performing angiography for further evaluation of this to determine if large-vessel vascular disease is present and revascularization is necessary. The risks and benefits were discussed. Informed consent was obtained.   DESCRIPTION OF PROCEDURE: The patient was brought to the vascular and interventional radiology suite. The right groin was sterilely prepped and draped and a sterile surgical field was created. Ultrasound was used to access a patent right femoral artery under direct ultrasound guidance with a micropuncture needle. A wire was passed down to the superficial femoral artery and a micropuncture sheath was  then placed. Imaging of the right lower extremity was then performed. This demonstrated a common femoral artery which was patent. The profunda femoris artery was patent. Superficial femoral artery was patent. At [facility] canal, there was a very minimal stenosis of 30% or less, without thrombus or dissection. He did have a normal popliteal artery. He had a normal tibial trifurcation at the peroneal artery, and the anterior tibial artery occluded distally, and the posterior tibial artery was continuous into the foot with good flow to the foot. At the level of the ankle there was some mild stenosis, but this was less than 50% and not significant. At this point, it would be presumed that with the exposure to HEPARIN, he had some small-vessel thrombosis distally in his foot and developed digital gangrene from this, but his large-vessel vascular status is intact. He will be maintained on Xarelto anticoagulation, and this will be monitored. At this point, the sheath was removed. It should be noted that no HEPARIN products were used during this procedure. Pressure was held at the site for 15 minutes. A sterile dressing was placed. The patient tolerated the procedure well and was taken to the recovery room in stable condition.     ____________________________ Algernon Huxley, MD jsd:dm D: 02/17/2013 11:48:00 ET T: 02/17/2013 12:52:11 ET JOB#: [id number]  cc: Algernon Huxley, MD, <Dictator> Algernon Huxley MD ELECTRONICALLY SIGNED 02/21/2013 14:16

## 2015-03-26 NOTE — Op Note (Signed)
DATE OF BIRTH:  [birth year]  DATE OF PROCEDURE:  01/27/2013  PREOPERATIVE DIAGNOSES: 1.  Peripheral arterial disease with ulceration and rest pain, left lower extremity.  2. Status post previous interventions performed percutaneously, initially at [facility] and subsequently here.  3.  Tobacco dependence.  4.  Diabetes.  5.  Hypertension.   POSTOPERATIVE DIAGNOSES:   1.  Peripheral arterial disease with ulceration and rest pain, left lower extremity.  2. Status post previous interventions performed percutaneously, initially at [facility] and subsequently here.  3.  Tobacco dependence.  4.  Diabetes.  5.  Hypertension.   PROCEDURES: 1.  Left femoral to below-knee popliteal bypass with PTFE.  2.  Saphenous vein harvest for planned placement, with inadequate vein for vein bypass.  3.  Endarterectomy of the left common femoral artery, profunda femoris artery and superficial femoral artery.   SURGEON:  Algernon Huxley, MD   ANESTHESIA:  General.   BLOOD LOSS:  Approximately 150 mL.   INDICATION FOR PROCEDURE:  This is a 69 year old white male who has peripheral vascular disease. He has undergone previous revascularization at [facility] last year. This failed. It was found that his initial stent placement crossed his profunda femoris artery, and when he failed he was profoundly ischemic. We were able to get this open percutaneously a couple of weeks ago, but this was not durable despite anticoagulation, and with the location of the stent, the only reasonable option remaining was a surgical bypass. The risk of limb loss was quite high, and this was discussed with the patient in detail up front. Risks and benefits were discussed. Informed consent is obtained.   DESCRIPTION OF PROCEDURE: The patient was brought to the operative suite. The left lower extremity was circumferentially prepped and draped, and a sterile surgical field was created. I initially started by making a femoral incision,  dissecting out the femoral artery. The profunda femoris artery and superficial femoral artery were encircled with vessel loops that were really 3 primary profunda branches that I controlled, as well as a lateral common femoral artery branch. I then turned my attention to the popliteal incision. The incision was started at the knee and dissected down. The popliteal artery was identified and found to be an adequate target for bypass. At this point, we dissected out the saphenous vein. This was small in the lower leg. It seemed to be a reasonable size proximally. However, at a branch in the proximal thigh, the vein became somewhat small. I was hopeful that this was mostly spasm from dissecting this out, and continued our dissection throughout the leg. I wanted to try to infuse saline and see if this would plump the vein and make it an adequate conduit for bypass. The vein was dissected out. All the saphenous vein branches ligated and divided between silk ties, and a couple of small saphenous vein branches were controlled with 6-0 Prolene sutures after removal. It was clamped, ligated and divided distally, and then clamped proximally. The saphenofemoral junction was oversewn with 5-0 Prolene suture. The vein was flushed with heparinized saline. The vein still appeared quite marginal, but I attempted to plug it and proximally see what kind of inflow we would have, to see if would support a bypass. The arteriotomy was created with an 11 blade and extended with Potts scissors. The stent that went into the common femoral artery was identified and removed well down into the superficial femoral artery with the proximal arteriotomy taken down just onto the superficial  femoral artery. Essentially an eversion endarterectomy was performed on the superficial femoral artery. An eversion endarterectomy was performed on the main profunda femoris artery as well. The proximal endpoint was cut flush with tenotomy scissors. The vein was  cut and beveled to an appropriate length to match the arteriotomy, and a proximal anastomosis was created with a running 6-0 Prolene suture. On opening the flow through the vein graft, it was very poor. There were several areas that were sclerotic and small, and could not be made to have adequate flow to support a bypass, despite having plugged in our proximal anastomosis and trying to avoid prosthetic, I did not feel I had another option at this point. The vein graft was removed and abandoned. I then tunneled from the popliteal incision to the femoral incision in the typical fashion for a prosthetic bypass to try to reduce the risk of infection. The marker was kept up for orientation. The proximal anastomosis was created with a running CV-6 suture in the usual fashion. Flushing through the graft showed excellent pulsatile inflow, much better than our vein bypass. The  rings were then removed, and the distal anastomosis was prepared after cutting and beveling the graft to an appropriate length to match an anterior arteriotomy on the popliteal artery, the popliteal artery being controlled with vessel loops proximally and distally. It was flushed with heparin proximally and distally, and there was no local thrombus at this area. The anastomosis was created with a running CV-6 suture in the usual fashion. The vessel was flushed and de-aired prior to release of control. On release, there was a nice pulsatile flow through the graft. Continuous-wave Doppler was used to listen to the popliteal artery below the graft, with excellent flow. Biphasic signals were heard in the posterior tibial artery and the anterior tibial artery at the ankle. At this point, I elected to terminate the procedure. The wounds were irrigated copiously. Surgicel and Evicyl topical hemostatic agents were placed, and hemostasis was complete. I closed the groin wound with 2 layers of 2-0 Vicryl, 2 layers of 3-0 Vicryl. The saphenectomy down to the  popliteal incision was closed with 2-0 Vicryl and 3-0 Vicryl. The skin was coapted with staples. An incisional VAC was placed due to the high risk nature of the wound. The patient was awakened from anesthesia and taken to the recovery room in stable condition, having tolerated the procedure well.    ____________________________ Algernon Huxley, MD jsd:mr D: 01/27/2013 15:28:00 ET T: 01/27/2013 20:12:46 ET JOB#: [id number]  cc: Algernon Huxley, MD, <Dictator> Isla Pence, MD  Algernon Huxley MD ELECTRONICALLY SIGNED 01/28/2013 8:11

## 2015-03-27 NOTE — H&P (Signed)
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 Leotis Pain, renal insufficiency; positive culture for MRSA in the past, vertigo, hospitalization in November 2015 for severe sepsis due to UTI, acute on chronic CHF, acute urinary retention and renal failure, right renal mass diagnosed on CT scan done in November 2015, history of deep venous thrombosis. The patient came to the [facility] today for a scheduled lab visit and possible transfusion support. He was found to have significant weakness and intermittent lethargy at home, low blood pressure with systolic blood pressure of 70s.   The patient is currently sitting in a chair and is awake and conversing appropriately. He states that he has been getting weaker quickly, and also appetite has been poor lately. Wife states that his oral intake has been poor for the last 2 to 3 days. He denies any fevers or chills. He denies any angina, palpitation, orthopnea, or PND. Has mild dyspnea at rest, gets more dyspneic on trying to get up and ambulate. Denies any falls or loss of consciousness. Platelet count is low but he denies any obvious bleeding symptoms, including bright red blood in stools, melena or hematuria.   PAST MEDICAL HISTORY AND PAST SURGICAL HISTORY: As in HPI above.   FAMILY HISTORY:  Remarkable for hypertension and heart disease.   SOCIAL HISTORY: History of chronic smoking. No history of alcohol or recreational drug usage. Lives with his wife.   HOME MEDICATIONS: Xanax 0.25 mg at bedtime, aspirin 81 mg daily, which has been on hold since thrombocytopenia, atorvastatin 10 mg daily, ciprofloxacin 250 mg q. 12 hours, docusate 200 mg b.i.d., Lasix 20 mg daily, lisinopril 5 mg daily, magnesium oxide 400 mg daily, metformin 500 mg once daily, MiraLax 17 g once daily p.r.n. for constipation, Phenergan 25 mg q. 6 hours p.r.n. for nausea, tamsulosin 0.4 mg once daily, trazodone 100 mg once daily at bedtime, Vitamin C 500 mg once daily.   ALLERGIES INCLUDE: HEPARIN.   REVIEW OF SYSTEMS: CONSTITUTIONAL: Severe weakness, intermittently lethargic and sleeping more at home. Denies fever or chills.  HEENT: Currently denies any dizziness at rest or headaches. No epistaxis, ear or jaw pain. No sinus symptoms.  CARDIAC: As in HPI.  LUNGS: No new cough, sputum, hemoptysis, or chest pain.  GASTROINTESTINAL: No nausea, vomiting, or diarrhea. No bright red blood in stools or melena.  GENITOURINARY: No dysuria or hematuria.  MUSCULOSKELETAL: No new bone pains.  SKIN: No new rashes or pruritus.  HEMATOLOGIC: Has easy bruising. Denies any new bleeding symptoms.  NEUROLOGIC: No new focal weakness, seizures or loss of consciousness.  ENDOCRINE: No polyuria or polydipsia. Appetite is poor.   PHYSICAL EXAMINATION:  GENERAL: The patient is sitting in recliner chair, weak and extremely pale; otherwise awake and oriented to self/place/person, and converses appropriately. No acute distress, but does get mild tachypnea on speaking sentences. No icterus.  VITAL SIGNS: Temperature 94.6, heart rate 112, respiratory rate 22, blood  pressure 83/55, oxygen saturation 100% on 2 L oxygen.  HEENT: Normocephalic, atraumatic. Extraocular movements intact. Sclerae anicteric.  Mouth is dry. No thrush.  NECK: Negative  for lymphadenopathy.  CARDIOVASCULAR: S1, S2, tachycardic, regular rate and rhythm.  LUNGS: Bilateral good breath sounds. No crepitations or rhonchi.  ABDOMEN: Soft, nontender, nondistended. No guarding or rigidity. Bowel sounds present.  EXTREMITIES: Show no right leg edema; he is status post left AKA.  SKIN: No generalized rashes, has minor bruising.  NEUROLOGIC: Limited exam. Cranial nerves intact. Moves all extremities spontaneously.   LABORATORY, DIAGNOSTIC, AND RADIOLOGICAL DATA: WBC 0.9, ANC 0.4, hemoglobin 3.5, platelets 30,000.   IMPRESSION AND PLAN:  1.  High risk myelodysplastic syndrome with severe pancytopenia requiring frequent transfusion support as outpatient also. Recently started on Vidaza chemotherapy about 2 weeks ago.  2.  Progressive severe symptomatic anemia - hemoglobin is down to 3.5 g. Denies any obvious bleeding symptoms. Could be secondary to bone marrow disorder and recent chemotherapy side effect from myelosuppression. The patient is being admitted to the hospital, given ongoing acute symptoms and sickness. Will transfuse 3 units of packed red blood cells today, premedicate with Tylenol and Benadryl. The patient was explained about transfusion, benefits and risks, and he signed the consent form to receive this.  3.  Poor oral intake, dehydration, hypotension. We will continue on normal saline at 125 mL per hour, and closely monitor blood pressure. We will monitor oral intake and consider Megace for appetite stimulation if he does not eat better. 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  4.  Neutropenic precautions and isolation. We will continue to monitor CBC daily. Transfuse as indicated. The patient has persistent thrombocytopenia, but no obvious bleeding issues. Plan is to transfuse platelets  if platelet count drops to 10,000 or less (or at higher counts if he has major bleeding issues).  5.  Continue other home medications, monitor blood sugar and sliding scale insulin as indicated. We will hold metformin for now, given poor oral intake.  6.  Code Status: The patient and wife present were explained that overall prognosis is likely poor given above issues and acute sickness. I have discussed Code Status in detail. The patient does not want mechanical ventilation or resuscitation in case of cardiopulmonary failure/arrest. We will order DNR status accordingly.   The patient and wife present were explained about details. They are agreeable to this plan.    ____________________________ Rhett Bannister. Ma Hillock, MD srp:MT D: 11/16/2014 14:08:41 ET T: 11/16/2014 14:32:39 ET JOB#: [id number]  cc: Willena Jeancharles R. Ma Hillock, MD, <Dictator> Alveta Heimlich MD ELECTRONICALLY SIGNED 11/16/2014 16:35

## 2015-03-27 NOTE — Consult Note (Signed)
General Aspect Primary cardiologist: New to [facility] __________________  69 year old male with history of recently diagnosed myelodysplastic syndrome 10/07/2014 on Vidaza chemotherapy, PAD s/p right iliac stenting s/p above left knee amputation, DM2, HTN, HLD, renal insufficieny, history of DVT, and right foot toe gangrene who was recently admitted to [facility] 10/2014 with UTI sepsis and acute on chronic systolic CHF with EF 35% at that time presented to [facility] after a scheduled lab visit at the [facility] on 12/14 and was found to be hypotensive and have a hgb of 3.5. During his admission he has received 6 units of packed RBCs bringing his hgb up to peak of 9.1 yesterday, 7.9 today. Repeat echo on 12/16 showed EF 35-40%, small anterior to the RV pericardial effusion. Echo was again repeated on 12/17 that showed an EF of 40-45%, diastolic dysfunction, mild diffuse HK suggestive of anterior and anteroseptal wall HK, Moderate pericardial effusion, estimated at 2.65 cm around the RV free wall.No significant hemodynamic compromise noted, normal RVSP. Diuresis was initiated with good response.  __________________  PMH: 1. recently diagnosed myelodysplastic syndrome 10/07/2014 on Vidaza chemotherapy 2. PAD s/p right iliac stenting s/p above left knee amputation 3. DM2 4. HTN 5. HLD 6. Renal insufficieny 7. History of DVT 8. right foot toe gangrene __________________   Present Illness 69 year old male with the above complex problem list who presented to [facility]16[facility] to get his blood drawn. He was found to be hypotensive SBP in the 70s, weak, and lethargic.   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 He reports having had a cath done at [facility] sometime after 2011 that showed nonobstructive disease. He does have a history of a normal Lexiscan Myoview in 2011 that was done by Dr. Jacinto Halim. Apparently his cath was  done 2/2 increased SOB/chest pain. He has not followed up with a cardiologist since.  He has been experiencing poor po intake for the past several days. He denies any fevers or chills. He denies any angina, palpitation, orthopnea, or PND. Has mild dyspnea at rest, gets more dyspneic on trying to get up and ambulate. Denies any falls or loss of consciousness.   Upon his arrival to [facility] he was found to have a hgb of 3.5. He has been transfused a total of 6 units of pRBC to a peak of 9.1 hgb. Today 7.9. Has been seen by GI. Heme positive. No prior luminal eval, possible when he is stable. He is approximately 8 L postive for this admission to date leading to increased SOB requiring BiPAP on 12/17. No further requirment. Now on nasal cannula. Currently on Lasix 20 mg daily and midodrine 5 mg tid. Not on bb 2/2 hypotension. Lisinopril was held 2/2 hypotension. He is resting comfortably.   Physical Exam:  GEN no acute distress   HEENT hearing intact to voice   NECK supple   RESP normal resp effort  clear BS   CARD Regular rate and rhythm  Normal, S1, S2  No murmur   ABD denies tenderness  soft   EXTR negative edema   SKIN normal to palpation   NEURO cranial nerves intact   PSYCH alert, A+O to time, place, person, good insight   Review of Systems:  General: Fatigue  Weakness   Skin: No Complaints   ENT: No Complaints   Eyes: No Complaints   Neck: No Complaints  Respiratory: Short of breath   Cardiovascular: Dyspnea   Gastrointestinal: No Complaints   Genitourinary: No Complaints   Vascular: No Complaints   Musculoskeletal: No Complaints   Neurologic: No Complaints   Hematologic: No Complaints   Endocrine: No Complaints   Psychiatric: No Complaints   Review of Systems: All other systems were reviewed and found to be negative   Medications/Allergies Reviewed Medications/Allergies reviewed   Family & Social History:  Family and Social History:  Family History  Hypertension  Diabetes Mellitus   Social History positive  tobacco (Current within 1 year), negative ETOH, negative Illicit drugs   Place of Living Home     Multi-drug Resistant Organism (MDRO): Positive culture for MRSA., 24-Feb-2013   Vertigo:    Renal Insufficiency:    Diabetes:    PVD - Peripheral Vascular Disease:    left aka:   Home Medications: Medication Instructions Status  promethazine 25 mg oral tablet 1 tab(s) orally every 6 hours as needed for nausea or vomiting Active  ciprofloxacin 250 mg oral tablet 1 tab(s) orally every 12 hours Active  furosemide 20 mg oral tablet 1 tab(s) orally once a day Active  polyethylene glycol 3350 oral powder for reconstitution 17 gram(s) orally once a day, As needed, constipation Active  docusate sodium 100 mg oral capsule 2 cap(s) orally 2 times a day Active  magnesium oxide 400 mg (241.3 mg elemental magnesium) oral tablet 1 tab(s) orally once a day Active  cefepime 2 gram(s)  every 8 hours x 2 days Active  tamsulosin 0.4 mg oral capsule 1 cap(s) orally once a day Active  Aspirin Enteric Coated 81 mg oral delayed release tablet 1 tab(s) orally once a day Active  metformin 500 mg oral tablet 1 tab(s) orally once a day Active  traZODone 100 mg oral tablet 1 tab(s) orally once a day (at bedtime) Active  lisinopril 5 mg oral tablet 1 tab(s) orally once a day Active  ALPRAZolam 0.25 mg oral tablet 1 tab(s) orally once a day (at bedtime) Active  Vitamin C 500 mg oral tablet 1 tab(s) orally once a day Active  atorvastatin 10 mg oral tablet 1  orally once a day Active   Lab Results:  Routine BB:  18-Dec-15 04:42   ABO Group + Rh Type O Positive  Antibody Screen NEGATIVE (Result(s) reported on 20 Nov 2014 at 06:23AM.)  Routine Chem:  18-Dec-15 04:42   Glucose, Serum  122  BUN  19  Creatinine (comp) 0.93  Sodium, Serum 139  Potassium, Serum 3.8  Chloride, Serum 106  CO2, Serum 26  Calcium (Total), Serum  7.4  Anion Gap 7   Osmolality (calc) 281  eGFR (African American) >60  eGFR (Non-African American) >60 (eGFR values <51mL/min/1.73 m2 may be an indication of chronic kidney disease (CKD). Calculated eGFR, using the MRDR Study equation, is useful in  patients with stable renal function. The eGFR calculation will not be reliable in acutely ill patients when serum creatinine is changing rapidly. It is not useful in patients on dialysis. The eGFR calculation may not be applicable to patients at the low and high extremes of body sizes, pregnant women, and vegetarians.)  Result Comment LABS - This specimen was collected through an   - indwelling catheter or arterial line.  - A minimum of 51mls of blood was wasted prior    - to collecting the sample.  Interpret  - results with caution.  Result(s) reported on 20 Nov 2014 at 07:56AM.  Routine  Hem:  18-Dec-15 04:42   WBC (CBC)  0.7  RBC (CBC)  2.72  Hemoglobin (CBC)  7.9  Hematocrit (CBC)  23.8  Platelet Count (CBC)  23  MCV 88  MCH 29.2  MCHC 33.3  RDW  14.9  Neutrophil % 32.0  Lymphocyte % 66.4  Monocyte % 0.4  Eosinophil % 0.8  Basophil % 0.4  Neutrophil #  0.2  Lymphocyte #  0.5  Monocyte #  0.0  Eosinophil # 0.0  Basophil # 0.0   EKG:  EKG Interp. by me   Interpretation not performed   Radiology Results: XRay:    17-Dec-15 03:41, Chest Portable Single View  Chest Portable Single View   REASON FOR EXAM:    shortness of breath  COMMENTS:       PROCEDURE: DXR - DXR PORTABLE CHEST SINGLE VIEW  - Nov 19 2014  3:41AM     CLINICAL DATA:  Shortness of breath    EXAM:  PORTABLE CHEST - 1 VIEW    COMPARISON:  10/10/2014    FINDINGS:  There is a right upper extremity PICC, tip at the distal SVC.  No cardiomegaly for technique. Visible portions of the aorta are  unremarkable. Fullness of the hila which is likely vascular. Diffuse  interstitial opacity with Kerley lines. Small bilateral pleural  effusions.     IMPRESSION:  Pulmonary  edema and small pleural effusions.      Electronically Signed    By: Jorje Guild M.D.    On: 11/19/2014 04:11         Verified By: Gilford Silvius, M.D.,  [place]:    17-Dec-15 10:30, [place] Color Flow Doppler Low Extrem Bilat (Legs)  [place] Color Flow Doppler Low Extrem Bilat (Legs)   REASON FOR EXAM:    Pain  COMMENTS:       PROCEDURE: [place]  - US DOPPLER LOW EXTR BILATERAL  - Nov 19 2014 10:30AM     CLINICAL DATA:  History of DVT, pain left greater than right, right  foot gangrene, history of bone marrow neoplasm, previous left above  knee amputation    EXAM:  BILATERAL LOWER EXTREMITY VENOUS DOPPLER ULTRASOUND    TECHNIQUE:  Gray-scale sonography with compression, as well as color and duplex  ultrasound, were performed to evaluate the deep venous system from  the level of the common femoral vein through the popliteal and  proximal calf veins.    COMPARISON:  07/08/2010    FINDINGS:  Normal compressibility of the common femoral, superficial femoral,  and popliteal veins, as well as the proximal calf veins. No filling  defects to suggest DVT on grayscale or color Doppler imaging.  Doppler waveforms show normal direction of venous flow, normal  respiratory phasicity and response to augmentation. The left  saphenofemoral junction was not identified. Left above knee  amputation.     IMPRESSION:  1. No evidence of lower extremity deep vein thrombosis, BILATERALLY.      Electronically Signed    By: Arne Cleveland M.D.    On: 11/19/2014 10:58         Verified By: Kandis Cocking, M.D.,  Cardiology:    16-Dec-15 19:06, Echo Doppler  Echo Doppler   REASON FOR EXAM:      COMMENTS:       PROCEDURE: [facility] - ECHO DOPPLER COMPLETE(TRANSTHOR)  - Nov 18 2014  7:06PM     RESULT: Echocardiogram Report    Patient Name:   Alan Reed  Date of Exam: 11/18/2014  Medical Rec #:  [id number]             Custom1:  Date of Birth:  [birth year]          Height:       71.0 in  Patient  Age:    52 years           Weight:       150.0 lb  Patient Gender: M                  BSA:          1.87 m??    Indications: SOB  Sonographer:    Janalee Dane RCS  Referring Phys:PANDIT, SANDEEP, R    Summary:   1. Left ventricular ejection fraction, by visual estimation, is 35 to   40%.   2. Mildly to moderately decreased global left ventricular systolic   function.   3. Mildly dilated left atrium.   4. Mildly dilated right atrium.   5. Small pericardial effusion, as described above.   6. The pericardial effusion is anterior to the right ventricle.   7. Mild mitral valve regurgitation.   8. Mild to moderate tricuspid regurgitation.   9. Mildly increased left ventricular posterior wall thickness.  2D AND M-MODE MEASUREMENTS (normal ranges within parentheses):  Left Ventricle:          Normal  IVSd (2D):      1.16 cm (0.7-1.1)  LVPWd (2D):     1.23 cm (0.7-1.1) Aorta/LA:                  Normal  LVIDd (2D):     3.79 cm(3.4-5.7) Aortic Root (2D): 3.40 cm (2.4-3.7)  LVIDs (2D):     2.98 cm           Left Atrium (2D): 4.60 cm (1.9-4.0)  LV FS (2D):     21.4 %   (>25%)  LV EF (2D):     44.1 %   (>50%)                                    Right Ventricle:                     RVd (2D):        8.76 cm  LV SYSTOLIC FUNCTION BY 2D PLANIMETRY (MOD):  EF-A4C View: 35.9 % EF-A2C View: 33.3 % EF-Biplane: 33.1 %  SPECTRAL DOPPLER ANALYSIS (where applicable):    PHYSICIAN INTERPRETATION:  Left Ventricle: The left ventricular internal cavity size was normal. LV     septal wall thickness was normal. LV posterior wall thickness was mildly   increased. Global LV systolic function was mildly to moderately   decreased. Left ventricular ejection fraction, by visualestimation, is   35 to 40%.  Right Ventricle: The right ventricular size is mildly enlarged. Global RV   systolic function is low normal.  Left Atrium: The left atrium is mildly dilated.  Right Atrium: The right atrium is mildly  dilated.  Pericardium: A small pericardial effusion is present. The pericardial   effusion is anterior to the right ventricle.  Mitral Valve: The mitral valve is normal in structure. Mild mitral valve   regurgitation is seen.  Tricuspid Valve: The tricuspid valve is normal. Mild to moderate   tricuspid regurgitation is visualized.  Aortic Valve: The aortic valve is normal.  No evidence of aortic valve     regurgitation is seen.  Pulmonic Valve: The pulmonic valve is normal. Trace pulmonic valve   regurgitation.    [id number] Callwood MD  Electronically signed by [id number] Lujean Amel MD  Signature Date/Time: 11/20/2014/8:29:17 AM    *** Final ***    IMPRESSION: .        Verified By: Yolonda Kida, M.D., MD    17-Dec-15 15:28, Echo Doppler  Echo Doppler   REASON FOR EXAM:      COMMENTS:       PROCEDURE: [place] - ECHO DOPPLER COMPLETE(TRANSTHOR)  - Nov 19 2014  3:28PM     RESULT: Echocardiogram Report    Patient Name:   Alan Reed Date of Exam: 11/19/2014  Medical Rec #:  [id number]             Custom1:  Date of Birth:  July 30, 1946          Height:       75.0 in  Patient Age:    89 years           Weight:       150.0 lb  Patient Gender: M                  BSA:          1.94 m??    Indications: CHF  Sonographer:    Sherrie Sport RDCS  Referring Phys: Leia Alf, R    Sonographer Comments: Technically difficult study due to poor echo   windows and The best quality of images is from the subcostal view.    Summary:   1. Challenging image quality.   2. Left ventricular ejection fraction, by visual estimation, is 40 to   45%.   3. Mildly to moderately decreased global left ventricular systolic   function.   4. Impaired relaxation pattern of LV diastolic filling.   5. Mild diffuse hypokinesis, select images suggestive of anterior and   anteroseptal wall hypokinesis.   6. Normal right ventricular size and systolic function.   7. Moderate pericardial effusion,  estimated at 2.65 cm around the RV   free wall.No significant hemodynamic compromise noted.   8. Mildly dilated left atrium.   9. Normal RVSP  2D AND M-MODE MEASUREMENTS (normal ranges within parentheses):  Left Ventricle:          Normal  IVSd (2D):      0.84 cm (0.7-1.1)  LVPWd (2D):     1.44 cm (0.7-1.1) Aorta/LA:                  Normal  LVIDd (2D):     4.61 cm (3.4-5.7) Left Atrium (2D): 5.00 cm (1.9-4.0)  LVIDs (2D):     4.01 cm  LV FS (2D):     12.9 %   (>25%)  LV EF (2D):     27.8 %   (>50%)   Right Ventricle:                                    RVd (2D):        4.12 cm  LV DIASTOLIC FUNCTION:  MV Peak E: 0.89 m/s E/e' Ratio: 11.20  MV Peak A: 1.10 m/s Decel Time: 185 msec  E/A Ratio: 0.81  SPECTRAL DOPPLER ANALYSIS (where applicable):  Mitral Valve:  MV P1/2 Time: 53.65 msec  MV Area, PHT:  4.10 cm??  Aortic Valve: AoV Max Vel: 1.65 m/s AoV Peak PG: 10.9 mmHg AoV Mean PG:  6.3 mmHg  LVOT Vmax: 1.03 m/s LVOT VTI: 0.176 m LVOT Diameter: 2.00 cm  AoV Area, Vmax: 1.96 cm?? AoV Area, VTI: 1.80 cm?? AoV Area, Vmn: 1.57 cm??  Tricuspid Valve and PA/RV Systolic Pressure: TR Max Velocity: 1.25 m/s RA   Pressure: 5 mmHg RVSP/PASP: 11.2 mmHg  Pulmonic Valve:  PV Max Velocity: 1.21 m/s PV Max PG: 5.9 mmHg PV Mean PG:  PHYSICIAN INTERPRETATION:  Left Ventricle: The left ventricular internal cavity size was normal. LV   posterior wall thickness was normal. No left ventricular hypertrophy.   Global LV systolic function was mildly to moderately decreased. Left   ventricular ejection fraction, by visual estimation, is 40 to 45%.   Spectral Doppler shows impaired relaxation pattern of LV diastolic   filling.  Right Ventricle: Normal right ventricular size, wall thickness, and   systolic function. The right ventricular size is normal. Global RV   systolic function is normal.  Left Atrium: The left atrium is mildly dilated.  Right Atrium: The right atrium is normal in size.  Pericardium:  A moderately sized pericardial effusion is present.  Mitral Valve: The mitral valve is normal in structure. Trace mitral valve   regurgitation is seen.  Tricuspid Valve: The tricuspid valve is normal. Trivial tricuspid   regurgitation is visualized. The tricuspid regurgitant velocity is 1.25   m/s, and with an assumed right atrial pressure of 5 mmHg, the estimated   right ventricular systolic pressure is normal at 11.2 mmHg.  Aortic Valve: The aortic valve is normal. The aortic valve is  structurally normal, with no evidence of sclerosis or stenosis. No   evidence of aortic valve regurgitation is seen.  Pulmonic Valve: The pulmonic valve is normal. Trace pulmonic valve   regurgitation.  Aorta: The aortic root and ascending aorta are structurally normal, with   no evidence of dilitation.    [id number] Ida Rogue MD  Electronically signed by [id number] Ida Rogue MD  Signature Date/Time: 11/19/2014/4:26:25 PM  *** Final ***    IMPRESSION: .        Verified By: Minna Merritts, M.D., MD    Heparin: Other  Vital Signs/Nurse's Notes: **Vital Signs.:   18-Dec-15 05:30  Vital Signs Type Routine  Temperature Temperature (F) 98.3  Celsius 36.8  Pulse Pulse 111  Respirations Respirations 24  Systolic BP Systolic BP 893  Diastolic BP (mmHg) Diastolic BP (mmHg) 71  Mean BP 86  Pulse Ox % Pulse Ox % 96  Pulse Ox Activity Level  At rest  Oxygen Delivery 2L    Impression 69 year old male with history of recently diagnosed myelodysplastic syndrome 10/07/2014 on Vidaza chemotherapy, PAD s/p right iliac stenting s/p above left knee amputation, DM2, HTN, HLD, renal insufficieny, history of DVT, and right foot toe gangrene who was recently admitted to [facility] 10/2014 with UTI sepsis and acute on chronic systolic CHF with EF 73% at that time presented to [facility] after a scheduled lab visit at the [facility] on 12/14 and was found to be hypotensive (SBP 70s) and have a hgb of 3.5 s/p multiple  transfusions of pRBC bringing his hgb up to peak of 9.1 yesterday, 7.9 today. Echo on 12/16 showed EF 35-40%, small anterior to the RV pericardial effusion. Echo was again repeated on 12/17 that showed an EF of 42-87%, diastolic dysfunction, mild diffuse HK suggestive of anterior and anteroseptal wall  HK, Moderate pericardial effusion, estimated at 2.65 cm around the RV free wall. No significant hemodynamic compromise noted, normal RVSP. Diuresis was initiated with good response.   1. Acute on chronic combined systolic and diastolic CHF: -This is a difficult situation given his overall condition and comorbidities  -Echo from 12/17 indicates improved LV systolic function and moderate pericardial effusion without hemodynamic compromise -He reports large UOP with diuresis - SCr stable -Would continue diuresis at Lasix 20 mg daily -Add low dose Coreg 3/125 mg bid -Add lisinopril 2.5 mg daily -For his hypotension can continue midodrine 5 mg tid -It is unclear at this time if this is ICM vs [place] 2/2 his chemotherapy Vidaza (per [facility] this agent can lead to CHF), he would like to pursue ischemic evaluation when he is stable to rule that out, however given his comorbidities (anemia 7.9 currently and thrmobocytopenia of 23,000) he is not a candidate for antiplatelet therapy should intervention be required, thus it would be best to pursue medical therapy at this time  2. Hypotension: -As above  3. Myelodysplastic syndrome: -Per oncology  4. HLD: -On Lipitor   Electronic Signatures for Addendum Section:  Kathlyn Sacramento (MD) (Signed Addendum 18-Dec-15 19:01)  The patient was seen and examined. Agree with the above. He has cardiomyopathy of unclear etiolgy with multiple comorbidites as outlined above. Agree with Coreg and ACE I. Can consider outpatient stress test once other issues improve.   Electronic Signatures: Kathlyn Sacramento (MD)  (Signed 18-Dec-15 19:01)  Co-Signer: General Aspect/Present  Illness, Home Medications, Allergies Rabab Currington, Areta Haber (PA-C)  (Signed 18-Dec-15 09:49)  Authored: General Aspect/Present Illness, History and Physical Exam, Review of System, Family & Social History, Past Medical History, Home Medications, Labs, EKG , Radiology, Allergies, Vital Signs/Nurse's Notes, Impression/Plan   Last Updated: 18-Dec-15 19:01 by Kathlyn Sacramento (MD)

## 2015-03-27 NOTE — Consult Note (Signed)
HEMATOLOGY follow-up note-overall better but still weak, dyspnea same, on nasal cannula oxygen. Denies fever or chills.denies bleeding issues. No bone pains.resting in bed, on nasal cannula oxygen, otherwise alert and oriented and in no acute distress. Pallor present           vitals - afebrile, stable, 94% on 3L oxygen           lungs - bilateral diminished breath sounds overall           abdomen - soft, nontender           skin - minor bruising CBC today shows Hb 6.9, platelets 25, WBC 0.9, ANC 0.4. Creatinine improved. 11/3 - serum B12, folate, direct Coombs test, LDH, haptoglobin, FDP, fibrinogen, HBsAg, HCV antibody, HIV antibody all unremarkable. SIEP polyclonal gammopathy. PT/INR 1.7, PTT 41.7, FDP<10 and fibrinogen 260.  10/05/14 - CT scan of abdomen/pelvis without contrast. IMPRESSION:  1. Moderate-sized bilateral pleural effusions with overlying atelectasis.  2. 4 cm partially calcified right upper pole renal mass worrisome for renal cell cancer. If the patient can have IV contrast that would be helpful for further evaluation. If not, an MRI with contrast (if possible) or without contrast is suggested.  3. No abdominal/pelvic lymphadenopathy.  4. Enlarged prostate gland and mild bladder distention.  5. Lower pole left renal calculus.  6. Advanced atherosclerotic calcifications involving the aorta and branch vessels.  69 year old gentleman with history of multiple medical problems admitted with progressive weakness and not eating, found to have severe pancytopenia which is sendary to Myelodysplasia diagnosed on Bone marrow biopsy of 10/07/14 ([facility]). Paient and wife explained about diagnosis of MDS, that it is incurable and discussed treatment approach including cytokine support and chemotherapy with vidaza/decitabine. He remains afebrile at this time. Continue on current supportive treatment, along with neutropenic isolation and diet, supportive transfusion as indicated by labs and symptoms. Will  start on GCSF support given neutropenia with recent critical illness. Will discuss Procrit with vasuclar surgeon given significant vascular issues. If discharged soon, will monitor closely as  outpatient and discuss chemotherapy versus supprotvie treatment only. Paient and wife explained above details, they are agreeable to this plan.  Electronic Signatures: Jonn Shingles (MD)  (Signed on 09-Nov-15 23:00)  Authored  Last Updated: 09-Nov-15 23:00 by Jonn Shingles (MD)

## 2015-03-27 NOTE — Consult Note (Signed)
PATIENT NAME:  Alan Reed, Alan Reed MR#:  [id number] DATE OF BIRTH:  1946-06-22  DATE OF CONSULTATION:  10/06/2014  CONSULTING PHYSICIAN:  Brixton Franko R. Ma Hillock, MD  REFERRING PHYSICIAN:  Monica Becton, MD   CONSULTING PHYSICIAN:  Alveta Heimlich, MD   REASON FOR CONSULTATION: Newly diagnosed pancytopenia.   HISTORY OF PRESENT ILLNESS: The patient is a 69 year old gentleman with past medical history significant for diabetes, hypertension, peripheral vascular disease status post left above knee amputation, right foot toe gangrene, followed by a vascular surgeon Dr. Lucky Cowboy, vertigo, renal insufficiency; positive culture for MRSA in the past, who has been admitted to the hospital yesterday with complaints of progressive generalized weakness and not eating. He was found to be hypotensive in the Emergency Room, stool Hemoccult negative, elevated BUN of 113, and creatinine of 2.5. He was found to have severe pancytopenia with hemoglobin of 4.2 grams, hematocrit 12.7, MCV elevated at 125, WBC 1000, platelet count 64,000; blood cultures negative so far. The patient remains afebrile. Repeat CBC done today a.m. shows white count was 800, with ANC of 400, hemoglobin 6.9, platelets 41,000; differential shows 47% neutrophils, 49% lymphocytes; so far, folate is normal, B12 is pending, and iron study shows low TIBC, elevated serum iron of 207, iron saturation of 94%, absolute retic count low at 0.015.   Clinically patient and wife present at bedside states that he does not have any known history of low blood counts in the past. They deny any known history of hepatitis B or C, chronic liver disease or splenomegaly, although CT scan of the abdomen and pelvis done this admission shows mild splenomegaly of 14 cm, and a right kidney mass; he denies any major bleeding symptoms including hematuria, bright red blood in stools or melena. States that appetite has been poor lately and he is extremely weak.   PAST MEDICAL HISTORY AND  PAST SURGICAL HISTORY: As in history of present illness above.   FAMILY HISTORY: Remarkable for heart disease and hypertension.   SOCIAL HISTORY: Chronic smoker, denies alcohol or recreational drug usage, lives with his wife.   HOME MEDICATIONS: Enteric-coated aspirin 81 mg daily, tamsulosin 0.4 mg daily, metformin 500 mg daily, Percocet 325/7.5 mg q. 6 hours p.r.n., trazodone 100 mg at bedtime, lisinopril 5 mg once daily, alprazolam 0.25 mg once at bedtime, vitamin C 500 mg daily, atorvastatin 10 mg daily, Plavix 75 mg daily, Coreg 6.25 mg b.i.d.   ALLERGIES: INCLUDE HEPARIN.   REVIEW OF SYSTEMS:   CONSTITUTIONAL: As in history of present illness, currently denies fevers or chills, no night sweats.  HEENT: Has dizziness on getting up and ambulating recently. No headaches, epistaxis, ear or jaw pain. No sinus symptoms.  CARDIAC: Denies any angina, palpitation, orthopnea, or PND, has dyspnea on exertion.  LUNGS: Has mild chronic dry cough, no hemoptysis or chest pain.  GASTROINTESTINAL: Occasional nausea. No vomiting or diarrhea. No blood in stools or melena.  GENITOURINARY: No dysuria or hematuria. Urine output is less lately.  SKIN: Has easy bruising, otherwise no rashes or pruritus.  HEMATOLOGIC: As in HPI.  EXTREMITIES: History of severe peripheral vascular disease issues as described in HPI, no new swelling or pain.  NEUROLOGIC: Denies any recent focal weakness, seizures or loss of consciousness.  ENDOCRINE: No polyuria or polydipsia. Appetite is poor lately.   PHYSICAL EXAMINATION: GENERAL: The patient is a moderately built and nourished individual, resting in bed, awake and converses appropriately, no acute distress at rest, no icterus, pallor present.  VITAL SIGNS: Temperature  97.8, pulse 88, respirations 24, blood pressure 97/59, sat 96% on room air.  HEENT: Normocephalic, atraumatic, extraocular movements intact. No oral thrush.  NECK: Negative for lymphadenopathy.   CARDIOVASCULAR: S1, S2, regular rate and rhythm.  LUNGS: Show bilateral good air entry, decreased at bases, no rhonchi.  ABDOMEN: Soft. No hepatosplenomegaly clinically.  LYMPHATIC: No adenopathy in axillary or inguinal areas either.  SKIN: Minor bruising. No rashes or major bruising or ecchymosis.  EXTREMITIES: Status post left AKA, dressing over the right foot with dry gangrene in toes, no purulent discharge.  NEUROLOGIC: Limited examination, cranial nerves seem intact, moves all extremities spontaneously.   LABORATORY RESULTS:  As in HPI above.   IMPRESSION AND RECOMMENDATIONS: A 69 year old gentleman with history of multiple medical problems as described above, admitted with progressive weakness and not eating, found to have severe pancytopenia with WBC of 800, ANC 400, hemoglobin 6.9, platelets 41,000, of unclear etiology. No history of alcohol abuse, chronic liver disease. CT scan does show mild splenomegaly and kidney mass. He also presented with renal insufficiency and BUN and creatinine today are beginning to improve with hydration. He remains afebrile at this time. He does not have obvious fever or infection but agree with keeping him on broad-spectrum IV antibiotic coverage given his acutely sick state and neutropenia. Also keep him on neutropenic isolation and diet. The patient will need further work-up for pancytopenia; so far iron study is indicative of anemia of chronic disease and folate level is unremarkable. Serum B12 is pending and 1 possibility could be severe B12 deficiency. Also we will get other work-up including HBsAg/HCV antibody/HIV antibody, ANA, LDH/haptoglobin/direct Coombs test to look for hemolysis, serum erythropoietin; PT/PTT/FDP/fibrinogen to look for DIC/coagulopathy, SIEP. We will tentatively plan to get bone marrow biopsy done tomorrow morning unless he has obvious B12 deficiency. Peripheral smear reviewed by pathologist, Dr. Truddie Coco yesterday reportedly does not show  blasts or hypersegmented neutrophils. The patient and family present were explained that if B12 is normal, then it raises likelihood of bone marrow disease and I have explained details regarding bone marrow biopsy procedure, patient is agreeable to this. Continue current supportive treatments and empiric broad-spectrum antibiotics, blood cultures remain negative so far. We will continue to follow.   Thank you for the referral. Please feel free to contact me for additional questions.    ____________________________ Rhett Bannister Ma Hillock, MD srp:nt D: 10/06/2014 17:45:30 ET T: 10/06/2014 18:21:15 ET JOB#: [id number]  cc: Kaniesha Barile R. Ma Hillock, MD, <Dictator> Alveta Heimlich MD ELECTRONICALLY SIGNED 10/13/2014 23:55

## 2015-03-27 NOTE — Consult Note (Signed)
   Comments   Case discussed with Dr Ma Hillock. Will hold on consult for now. Please reconsult if needed.   Electronic Signatures: Sharyl Panchal, Kirt Boys (NP)  (Signed 15-Dec-15 14:44)  Authored: Palliative Care   Last Updated: 15-Dec-15 14:44 by Irean Hong (NP)

## 2015-03-27 NOTE — Consult Note (Signed)
Brief Consult Note: Diagnosis: anemia.   Patient was seen by consultant.   Consult note dictated.   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 There is no history of luminal evaluation. Denies abdominal pain, dyspepsia, NVD, melena/ hematochezia, problems swallowing, and all other GI related complaints. Denies NSAIDs. Continues with severe leukopenia (0.7) on neutropenic precautions and severe thrombocytopenia (26). Hemodynamically stable. Did have some ARF on admit that has improved. Stayed briefly in CCU for sob/tachycardia, now on floor as he improved.Abdomen exam benign. Modified rectal exam with large protruding external hemorrhoids. Digital exam deferred d/t leukopenia. Had a large green stool today per chart review. Impression and plan. Anemia- likely multifactorial. Is on chemotherapy. Has myelodysplastic syndrome and CRI. Heme positive result is nonspecific. May be related to his large external hemorrhoids that may bleed more easily due to his significant thrombocytopenia. Recommend treating these for now. May benefit from luminal evaluation once his blood counts and clinical condition stablizes..  Electronic Signatures: Stephens November H (NP)  (Signed 16-Dec-15 15:54)  Authored: Brief Consult Note   Last Updated: 16-Dec-15 15:54 by Theodore Demark (NP)

## 2015-03-27 NOTE — Consult Note (Signed)
PATIENT NAME:  Alan Reed, ROTUNDO MR#:  [id number] DATE OF BIRTH:  02/06/1946  DATE OF CONSULTATION:  11/16/2014  REFERRING PHYSICIAN:  Sandeep R. Ma Hillock, MD CONSULTING PHYSICIAN:  Effrey Davidow S. Manuella Ghazi, MD  PRIMARY CARE PHYSICIAN:  Isla Pence, MD  ONCOLOGY:  Rhett Bannister. Ma Hillock, MD  REASON FOR CONSULTATION: Tachycardia, chest pain.   HISTORY OF PRESENT ILLNESS: The patient is a 69 year old male with a known history of myelodysplastic syndrome, on chemotherapy, severe pancytopenia, was admitted for severe symptomatic anemia with hemoglobin of 3.5 gram. He was ordered 3 units of blood by admitting physician, but earlier today he started having severe chest pain and was also hypotensive along with tachycardic for which he was shifted to CCU and we are consulted for further management.   Upon talking to patient, he is certainly in severe chest pain, 10/10, midsternal, nonradiating anywhere. His blood pressure was 90/62 and he is tachycardic. He denies any shortness of breath. He denies having any chest pain before this.   PAST MEDICAL HISTORY: 1.  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 2.  Severe pancytopenia.  3.  History of vertigo.  4.  Diabetes.  5.  Peripheral vascular disease, requiring stenting.   PAST SURGICAL HISTORY: Left above-knee amputation. Stenting in the lower extremities by Dr. Lucky Cowboy due to peripheral vascular disease.  SOCIAL HISTORY: Former smoker. Occasional alcohol. No illicit drugs. He is married, lives with his wife.   FAMILY HISTORY: Positive for peripheral vascular disease and heart problems.   ALLERGIES: HEPARIN.   MEDICATIONS AT HOME: 1.  Alprazolam 0.25 mg p.o. at bedtime.  2.  Aspirin 81 mg p.o. daily.  3.  Atorvastatin 10 mg p.o. daily.  4.  Colace 100 mg 2 capsules p.o. b.i.d.  5.  Lasix 20 mg p.o. daily.  6.  Lisinopril 5 mg p.o. daily.  7.  Magnesium oxide 400 mg p.o. daily.  8.  Metformin 500 mg p.o. daily. 9.  MiraLAX once daily as  needed.  10.  Promethazine 25 mg p.o. every 6 hours as needed.  11.  Tamsulosin 0.4 mg p.o. daily. 12.  Trazodone 100 mg p.o. at bedtime.  13.  Vitamin C 500 mg p.o. daily.   REVIEW OF SYSTEMS:  CONSTITUTIONAL: No fever. Positive fatigue and weakness.  EYES: No blurred or double vision.  EARS, NOSE, THROAT: No tinnitus or ear pain.  RESPIRATORY: No cough, wheezing, hemoptysis.  CARDIOVASCULAR: Positive for chest pain. No orthopnea, edema.  GASTROINTESTINAL: No nausea, vomiting, diarrhea.  GENITOURINARY: No dysuria or hematuria.  ENDOCRINE: No polyuria or nocturia.  HEMATOLOGY: Positive for severe symptomatic anemia. No bleeding, severe pancytopenia followed by oncology.  NEUROLOGIC: No tingling, numbness, weakness.  PSYCHIATRY: No history of anxiety or depression.   PHYSICAL EXAMINATION: VITAL SIGNS: Temperature 97.6, heart rate 125 per minute, respirations 21 per minute, blood pressure 90/62. He was saturating 96% on 2 liter oxygen via nasal cannula.  GENERAL: The patient is a 69 year old male lying in the bed in severe anginal chest pain.  HEENT: Eyes, pupils equal, round, reactive to light and accommodation. No scleral icterus. Extraocular muscles intact. Head atraumatic, normocephalic. Oropharynx and nasopharynx clear.  NECK: Supple, no jugular venous distention, no thyroid enlargement or tenderness.  LUNGS: Clear to auscultation bilaterally. No wheezing, rales, rhonchi, or crepitation.  CARDIOVASCULAR: S1, S2 normal. No murmurs, rubs, or gallops.  ABDOMEN: Soft, nontender, nondistended. Bowel sounds present. No organomegaly or mass.  EXTREMITIES: No pedal edema, cyanosis, or clubbing. He has a left above-knee amputation.  SKIN: No obvious rash or lesion.  NEUROLOGIC: Cranial nerves III through XII intact.  Muscle strength 5/5 in extremities. Left extremity did not check.  He does have a lot of pain all over his extremities and he is restless. Sensation seems intact.  PSYCHIATRIC:   The patient is alert and oriented x 3.  MUSCULOSKELETAL: No joint effusion or tenderness.   LABORATORY DATA: Normal BMP, except BUN of 70, creatinine 1.42, blood sugar 193. Normal liver function tests. CK of 21, MB fraction 3.0. CBC showed WBC of 0.9, hemoglobin 3.5, hematocrit 10.4, platelets 30,000.   IMPRESSION AND PLAN: 1.  Unstable angina, likely due to supply-demand ischemia from severe symptomatic anemia. Will give him sublingual nitroglycerin 1 time. It actually seemed to help him and his pain is relieved now.  2.  Severe symptomatic anemia with hemoglobin of 3.5 on admission. He was ordered 3 units of packed red blood cells. He has already finished 1 unit. Hoping to get hemoglobin close to 7, which should help his symptoms also. 3.  Sinus tachycardia, likely due to severe anemia.  Should get better with replacement with blood transfusion.  4.  High-risk myelodysplastic syndrome with severe pancytopenia, getting chemotherapy by Dr. Ma Hillock.  Discussed with Dr. Ma Hillock, who feels we may need to give him a couple of more weeks to see effect, although the family was not too sure that he is responding at this time.   CODE STATUS: DO NOT RESUSCITATE.   I had a discussion with family who are leaning towards comfort care. Discussed with Dr. Izora Gala Phifer who might see him tomorrow. Family is in agreement.   TOTAL TIME TAKING CARE OF THIS PATIENT (CRITICAL CARE): 55 minutes. He remains at high risk for cardiorespiratory failure and possible death.    ____________________________ Lucina Mellow. Manuella Ghazi, MD vss:LT D: 11/16/2014 17:32:41 ET T: 11/16/2014 18:33:10 ET JOB#: [id number]  cc: Arianne Klinge S. Manuella Ghazi, MD, <Dictator> Remer Macho MD ELECTRONICALLY SIGNED 11/17/2014 18:16

## 2015-03-27 NOTE — Consult Note (Signed)
PATIENT NAME:  Alan Reed, Alan Reed MR#:  [id number] DATE OF BIRTH:  1946/12/02  DATE OF CONSULTATION:  10/06/2014  REQUESTING PHYSICIAN:  Monica Becton, MD    CONSULTING PHYSICIAN:  Otelia Limes. Yves Dill, MD  REASON FOR CONSULTATION: Renal mass.   HISTORY OF PRESENT ILLNESS: Alan Reed is a 69 year old Caucasian male admitted to the hospital with hypotension and gangrene of his toes.  During his evaluation, he had a CT scan done 11/02 which indicated the presence of a partially calcified 4 cm right upper pole renal lesion. He also had a left renal calculus.  The patient specifically denies flank pain or hematuria. The patient also developed acute urinary retention today requiring Foley catheter placement. He normally voids quite well and denies history of prostate problems or prostate surgery.   ALLERGIES: HEPARIN.   CHRONIC HOME MEDICATIONS: Include tamsulosin, aspirin, metformin, Percocet, trazodone, lisinopril, alprazolam, vitamin C, atorvastatin, Plavix and Coreg.   PAST SURGICAL HISTORY:  Left above the knee amputation.   PAST AND CURRENT MEDICAL CONDITIONS:  1.  Peripheral vascular disease. 2.  Diabetes.  3.  Renal insufficiency.  4.  Vertigo.   REVIEW OF SYSTEMS: The patient denied hematuria, flank pain, past history of kidney stone disease, or past history of prostate cancer or prostate disease.   PHYSICAL EXAMINATION: Deferred.   DATA:  CT scan report dated 11/02 was reviewed.   PERTINENT LABORATORY STUDIES: BUN 96, creatinine 1.70.   IMPRESSION:  1.  Right renal mass. 2.  Left nephrolithiasis.  3.  Acute urinary retention.   SUGGESTIONS:  1.  Remove the Foley catheter when patient is ambulatory for voiding trial. 2.  Follow up in the office for further evaluation of the renal mass.      ___________________________ Otelia Limes. Yves Dill, MD mrw:DT D: 10/06/2014 12:29:17 ET T: 10/06/2014 13:13:54 ET JOB#: [id number]  cc: Otelia Limes. Yves Dill, MD, <Dictator> Royston Cowper  MD ELECTRONICALLY SIGNED 10/06/2014 13:48

## 2015-03-27 NOTE — H&P (Signed)
Subjective/Chief Complaint Not eating, gen weakness   History of Present Illness Alan Reed 69yrold with h/o PVD s/p lt AKA, HTN, cont tobacco use comes with not eating for the last 1 week. Seen by PCP and EMS and sent home. Came to ED as symptoms were worsening. Was hypotensive responded to IVF of 1 L. Low WBC 1, Hb 4.2 and plts of 64. Stool occult neg. Elevated BUN 113 and cr 2.5. has gangrenous toe on the rt f/u by vascular.   Code Status DNR   Past Med/Surgical Hx:  Multi-drug Resistant Organism (MDRO): Positive culture for MRSA.  Vertigo:   Renal Insufficiency:   Diabetes:   PVD - Peripheral Vascular Disease:   left aka:   ALLERGIES:  Heparin: Other  HOME MEDICATIONS: Medication Instructions Status  tamsulosin 0.4 mg oral capsule 1 cap(s) orally once a day Active  Aspirin Enteric Coated 81 mg oral delayed release tablet 1 tab(s) orally once a day Active  metformin 500 mg oral tablet 1 tab(s) orally once a day Active  acetaminophen-oxyCODONE 325 mg-7.5 mg oral tablet 1 tab(s) orally every 6 hours, As Needed Active  traZODone 100 mg oral tablet 1 tab(s) orally once a day (at bedtime) Active  lisinopril 5 mg oral tablet 1 tab(s) orally once a day Active  ALPRAZolam 0.25 mg oral tablet 1 tab(s) orally once a day (at bedtime) Active  Vitamin C 500 mg oral tablet 1 tab(s) orally once a day Active  atorvastatin 10 mg oral tablet 1  orally once a day Active  Plavix 75 mg oral tablet 1 tab(s) orally once a day Active  Coreg 6.25 mg oral tablet 1 tab(s) orally 2 times a day Active   Family and Social History:  Family History Coronary Artery Disease  Hypertension   Social History positive  tobacco, negative ETOH, negative Illicit drugs   Place of Living Home   Review of Systems:  Fever/Chills No   Cough Yes   Sputum No   Abdominal Pain No   Diarrhea No   Constipation No   Nausea/Vomiting No   SOB/DOE Yes   Chest Pain No   Telemetry Reviewed NSR   Dysuria No   decreased urine output   Tolerating Diet Yes   Physical Exam:  GEN disheveled   NECK supple  No masses   RESP normal resp effort   CARD regular rate  no murmur  no JVD   ABD denies tenderness   EXTR positive cyanosis/clubbing   NEURO cranial nerves intact   Lab Results: Hepatic:  02-Nov-15 18:43   Bilirubin, Total -  Alkaline Phosphatase - (46-116 NOTE: New Reference Range 06/23/14)  SGPT (ALT) - (14-63 NOTE: New Reference Range 06/23/14)  SGOT (AST) -  Total Protein, Serum -  Albumin, Serum -    19:38   Bilirubin, Total 0.8  Alkaline Phosphatase  121 (46-116 NOTE: New Reference Range 06/23/14)  SGPT (ALT)  100 (14-63 NOTE: New Reference Range 06/23/14)  SGOT (AST) 35  Total Protein, Serum 6.9  Albumin, Serum  2.7  Routine Chem:  02-Nov-15 18:43   Result Comment TROPONIN - RECOLLECT TO CONFIRM RESULTS. CALLED  - TO [facility] AT 1916 10/05/2014  - BY TFK.  Result(s) reported on 05 Oct 2014 at 07:21PM.  Result Comment HEMOGRAM/PLATELET - RECOLLECT TO CONFIRM RESULTS. CALLED TO  -[facility] AT [id number]/01/2014. TFK  Result(s) reported on 05 Oct 2014 at 07:19PM.  Result Comment METC  - RECOLLECT TO  CONFIRM RESULTS. CALLED TO  [facility] AT [phone] 10/05/2014. TFK  Result(s) reported on 05 Oct 2014 at 07:21PM.  Glucose, Serum -  BUN -  Creatinine (comp) -  Sodium, Serum -  Potassium, Serum -  Chloride, Serum -  CO2, Serum -  Calcium (Total), Serum -  Osmolality (calc) -  eGFR (African American) -  eGFR (Non-African American) - (eGFR values <47m/min/1.73 m2 may be an indication of chronic kidney disease (CKD). Calculated eGFR, using the MRDR Study equation, is useful in  patients with stable renal function. The eGFR calculation will not be reliable in acutely ill patients when serum creatinine is changing rapidly. It is not useful in patients on dialysis. The eGFR calculation may not be applicable to patients at the low and high  extremes of body sizes, pregnant women, and vegetarians.)  Anion Gap -    19:38   Result Comment WBC/HGB/HCT - RESULTS VERIFIED BY REPEAT TESTING.  - NOTIFIED OF CRITICAL VALUE  - TO [facility] AT 2051 10/05/2014  - BY [facility].  - READ-BACK PROCESS PERFORMED.  - SMEAR SCANNED  Result(s) reported on 05 Oct 2014 at 08:58PM.  Result Comment troponin - RESULTS VERIFIED BY REPEAT TESTING.  - notified hMallie Musselriveria 10/05/14  - _0  rdj.  - READ-BACK PROCESS PERFORMED.  Result(s) reported on 05 Oct 2014 at 08:55PM.  Glucose, Serum  216  BUN  113  Creatinine (comp)  2.27  Sodium, Serum 138  Potassium, Serum 4.3  Chloride, Serum 107  CO2, Serum  20  Calcium (Total), Serum  7.4  Osmolality (calc) 318  eGFR (African American)  37  eGFR (Non-African American)  30 (eGFR values <667mmin/1.73 m2 may be an indication of chronic kidney disease (CKD). Calculated eGFR, using the MRDR Study equation, is useful in  patients with stable renal function. The eGFR calculation will not be reliable in acutely ill patients when serum creatinine is changing rapidly. It is not useful in patients on dialysis. The eGFR calculation may not be applicable to patients at the low and high extremes of body sizes, pregnant women, and vegetarians.)  Anion Gap 11  Magnesium, Serum  2.6 (1.8-2.4 THERAPEUTIC RANGE: 4-7 mg/dL TOXIC: > 10 mg/dL  -----------------------)  Phosphorus, Serum  5.3 (Result(s) reported on 05 Oct 2014 at 08:39PM.)  Cardiac:  02-Nov-15 18:43   Troponin I - (0.00-0.05 0.05 ng/mL or less: NEGATIVE  Repeat testing in 3-6 hrs  if clinically indicated. >0.05 ng/mL: POTENTIAL  MYOCARDIAL INJURY. Repeat  testing in 3-6 hrs if  clinically indicated. NOTE: An increase or decrease  of 30% or more on serial  testing suggests a  clinically important change)    19:38   Troponin I  0.23 (0.00-0.05 0.05 ng/mL or less: NEGATIVE  Repeat testing in 3-6 hrs  if clinically indicated. >0.05  ng/mL: POTENTIAL  MYOCARDIAL INJURY. Repeat  testing in 3-6 hrs if  clinically indicated. NOTE: An increase or decrease  of 30% or more on serial  testing suggests a  clinically important change)  Routine Hem:  02-Nov-15 18:43   WBC (CBC) -  RBC (CBC) -  Hemoglobin (CBC) -  Hematocrit (CBC) -  Platelet Count (CBC) -  MCV -  MCH -  MCHC -  RDW -    19:38   WBC (CBC)  1.0  RBC (CBC)  1.01  Hemoglobin (CBC)  4.2  Hematocrit (CBC)  12.7  Platelet Count (CBC)  64  MCV  125  MCH  41.6  MCHC 33.2  RDW  17.3   Radiology Results: XRay:    02-Nov-15 19:35, Chest Portable Single View  Chest Portable Single View  REASON FOR EXAM:    Sepsis  COMMENTS:       PROCEDURE: DXR - DXR PORTABLE CHEST SINGLE VIEW  - Oct 05 2014  7:35PM     CLINICAL DATA:  Right foot gangrene, vomiting, renal insufficiency    EXAM:  PORTABLE CHEST - 1 VIEW    COMPARISON:  02/25/2013    FINDINGS:  Cardiomegaly with mild interstitial edema.  No pleural effusion or pneumothorax.     IMPRESSION:  Cardiomegaly with mild interstitial edema.      Electronically Signed    By: Julian Hy M.D.    On: 10/05/2014 19:59         Verified By: Julian Hy, M.D.,  LabUnknown:    02-Nov-15 20:39, CT Abdomen and Pelvis Without Contrast  PACS Image  CT:  CT Abdomen and Pelvis Without Contrast  REASON FOR EXAM:    (1) perinium ttp, diabetic pt, anemic; (2) perinium   ttp, diabetic pt, anemic  COMMENTS:       PROCEDURE: CT  - CT ABDOMEN AND PELVIS W0  - Oct 05 2014  8:39PM     CLINICAL DATA:  Vomiting and abdominal pain since yesterday.    EXAM:  CT ABDOMEN AND PELVIS WITHOUT CONTRAST    TECHNIQUE:  Multidetector CT imaging of the abdomen and pelvis was performed  following the standard protocol without IV contrast.  COMPARISON:  None.    FINDINGS:  Lower chest: There are moderate size bilateral pleural effusions  with overlying atelectasis. The heart is normal in size.  No  pericardial effusion. Three-vessel coronary artery calcifications  are noted. The esophagus is grossly normal.    Hepatobiliary: No focal hepatic lesions or intrahepatic biliary  dilatation. Gallbladder it is normal. No common bile duct  dilatation.    Pancreas: Grossly normal without contrast.    Spleen: Splenomegaly.  14.5 x 12.3 x 9.5 cm No focal lesions.  Adrenals/Urinary Tract: Complex 4 cm right upper pole renal mass  worrisome for renal cell carcinoma. A left renal calculus is noted  in the lower pole Both adrenal glands are unremarkable.    Stomach/Bowel: The stomach, duodenum, small bowel and colon are  grossly normal without oral contrast. No inflammatory changes, mass  lesions or obstructive findings. The terminal ileum is normal. The  appendix is normal.    Vascular/Lymphatic: No mesenteric or retroperitoneal mass are  adenopathy. The aorta demonstrates moderate atherosclerotic  calcifications. Similar findings involving the branch vessels. The  right iliac artery stents is noted.    Pelvis: The prostate gland is enlarged. The bladder is mildly  distended. No bladder mass or bladder calculi. No pelvic mass or  adenopathy. No free pelvicfluid collections. Surgical changes noted  from bilateral inguinal hernia repairs. No inguinal mass or  adenopathy. A right common femoral bypass graft is noted.    Musculoskeletal: The bony structures are unremarkable.     IMPRESSION:  1. Moderate-sized bilateral pleural effusions with overlying  atelectasis.  2. 4 cm partially calcified right upper pole renal mass worrisome  for renal cell cancer. If the patient can have IV contrast that  would be helpful for further evaluation. If not, an MRI with  contrast (if possible) or without contrast is suggested.  3. No abdominal/pelvic lymphadenopathy.  4. Enlarged prostate gland and mild bladder distention.  5. Lower pole left renal calculus.  6. Advanced atherosclerotic  calcifications involving the aorta and  branch vessels.      Electronically Signed    By: Kalman Jewels M.D.    On: 10/05/2014 20:50         Verified By: Marlane Hatcher, M.D.,    Assessment/Admission Diagnosis == Sepsis- blood cx, vanc and cefepime. neutropenic precautions.  ==pancytopenia iron profile, B12, folate RBC, retic count. No signs of bleeding transfuse 2 units of PRBC  ==Ac renal failure- prerenal and ATN from low BP. IVF avoid nephrotoxic agents hold lisinopril  ==renal mass hoghly concerning for renal cell ca. consult urology  == rt necrotic toe dry. Cont with vascular and wound care  ==Debility PT/OT  ==Cont tobacco use   Electronic Signatures: Monica Becton (MD)  (Signed [phone] 22:23)  Authored: CHIEF COMPLAINT and HISTORY, PAST MEDICAL/SURGIAL HISTORY, ALLERGIES, HOME MEDICATIONS, FAMILY AND SOCIAL HISTORY, REVIEW OF SYSTEMS, PHYSICAL EXAM, LABS, Radiology, ASSESSMENT AND PLAN   Last Updated: 02-Nov-15 22:23 by Monica Becton (MD)

## 2015-03-27 NOTE — Consult Note (Signed)
Chief Complaint:  Subjective/Chief Complaint seen for anemia and heme positive stool.  no evidence of GI bleeding overnight. patient with episode of shortness of breath overnight, likely fluid overload.  Currently feeling better, no sob.  denies abdominal pain or nausea, tolerating po.   VITAL SIGNS/ANCILLARY NOTES: **Vital Signs.:   17-Dec-15 08:48  Vital Signs Type Pre Medication  Temperature Temperature (F) 98.1  Celsius 36.7  Temperature Source oral  Pulse Pulse 118  Respirations Respirations 22  Systolic BP Systolic BP 076  Diastolic BP (mmHg) Diastolic BP (mmHg) 67  Mean BP 78  Pulse Ox % Pulse Ox % 95  Pulse Ox Activity Level  At rest  Oxygen Delivery 2L   Brief Assessment:  Cardiac Regular   Respiratory clear BS   Gastrointestinal details normal Soft  Nontender  Nondistended  No masses palpable  Bowel sounds normal   Lab Results: LabObservation:  17-Dec-15 10:30   OBSERVATION Reason for Test Pain  TDMs:  17-Dec-15 05:33   Vancomycin, Trough LAB  8  Routine Chem:  17-Dec-15 05:33   Glucose, Serum  208  BUN  22  Creatinine (comp) 1.02  Sodium, Serum 137  Potassium, Serum 3.8  Chloride, Serum 107  CO2, Serum 23  Calcium (Total), Serum  7.6  Anion Gap 7  Osmolality (calc) 283  eGFR (African American) >60  eGFR (Non-African American) >60 (eGFR values <82m/min/1.73 m2 may be an indication of chronic kidney disease (CKD). Calculated eGFR, using the MRDR Study equation, is useful in  patients with stable renal function. The eGFR calculation will not be reliable in acutely ill patients when serum creatinine is changing rapidly. It is not useful in patients on dialysis. The eGFR calculation may not be applicable to patients at the low and high extremes of body sizes, pregnant women, and vegetarians.)  Result Comment CBC - SMEAR SCANNED WBC/PLATELET - RESULTS VERIFIED BY REPEAT TESTING.  - NOTIFIED OF CRITICAL VALUE  - READ-BACK PROCESS PERFORMED.  - CALLED  TO [place] 12/17/   - 15. [place] - VERIFIED BY SMEAR ESTIMATE  Result(s) reported on 19 Nov 2014 at 07:45AM.  Result Comment LABS - This specimen was collected through an   - indwelling catheter or arterial line.  - A minimum of 556m of blood was wasted prior    - to collecting the sample.  Interpret  - results with caution.  Result(s) reported on 19 Nov 2014 at 06:29AM.  Routine Hem:  15-Dec-15 04:39   Hemoglobin (CBC)  5.2  16-Dec-15 01:58   Hemoglobin (CBC)  7.6    02:00   Hemoglobin (CBC) -  17-Dec-15 05:33   WBC (CBC)  1.2  RBC (CBC)  3.03  Hemoglobin (CBC)  9.1  Hematocrit (CBC)  26.3  Platelet Count (CBC)  28  MCV 87  MCH 30.0  MCHC 34.6  RDW  15.0  Neutrophil % 65.6  Lymphocyte % 30.0  Monocyte % 3.3  Eosinophil % 0.6  Basophil % 0.5  Neutrophil #  0.8  Lymphocyte #  0.4  Monocyte #  0.0  Eosinophil # 0.0  Basophil # 0.0   Radiology Results: XRay:    17-Dec-15 03:41, Chest Portable Single View  Chest Portable Single View   REASON FOR EXAM:    shortness of breath  COMMENTS:       PROCEDURE: DXR - DXR PORTABLE CHEST SINGLE VIEW  - Nov 19 2014  3:41AM     CLINICAL DATA:  Shortness of  breath    EXAM:  PORTABLE CHEST - 1 VIEW    COMPARISON:  10/10/2014    FINDINGS:  There is a right upper extremity PICC, tip at the distal SVC.  No cardiomegaly for technique. Visible portions of the aorta are  unremarkable. Fullness of the hila which is likely vascular. Diffuse  interstitial opacity with Kerley lines. Small bilateral pleural  effusions.     IMPRESSION:  Pulmonary edema and small pleural effusions.      Electronically Signed    By: Jorje Guild M.D.    On: 11/19/2014 04:11         Verified By: Gilford Silvius, M.D.,  [place]:    17-Dec-15 10:30, [place] Color Flow Doppler Low Extrem Bilat (Legs)  [place] Color Flow Doppler Low Extrem Bilat (Legs)   REASON FOR EXAM:    Pain  COMMENTS:       PROCEDURE: [place]  - US DOPPLER LOW EXTR  BILATERAL  - Nov 19 2014 10:30AM     CLINICAL DATA:  History of DVT, pain left greater than right, right  foot gangrene, history of bone marrow neoplasm, previous left above  knee amputation    EXAM:  BILATERAL LOWER EXTREMITY VENOUS DOPPLER ULTRASOUND    TECHNIQUE:  Gray-scale sonography with compression, as well as color and duplex  ultrasound, were performed to evaluate the deep venous system from  the level of the common femoral vein through the popliteal and  proximal calf veins.    COMPARISON:  07/08/2010    FINDINGS:  Normal compressibility of the common femoral, superficial femoral,  and popliteal veins, as well as the proximal calf veins. No filling  defects to suggest DVT on grayscale or color Doppler imaging.  Doppler waveforms show normal direction of venous flow, normal  respiratory phasicity and response to augmentation. The left  saphenofemoral junction was not identified. Left above knee  amputation.     IMPRESSION:  1. No evidence of lower extremity deep vein thrombosis, BILATERALLY.      Electronically Signed    By: Arne Cleveland M.D.    On: 11/19/2014 10:58         Verified By: Kandis Cocking, M.D.,   Assessment/Plan:  Assessment/Plan:  Assessment 1) anemia, heme positive stool.  patient with marked 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 hemodynamically stable.  no gross evidence of gi bleeding, hemorrhoids noted.   Plan 1) continue serial labs with tfx as needed. agree with luminal evaluations when clinically feasible, after resolution of current situation.   will follow from a distance.  continue po bid ppi, will order h pylori serology.   Electronic Signatures: Loistine Simas (MD)  (Signed 17-Dec-15 13:33)  Authored: Chief Complaint, VITAL SIGNS/ANCILLARY NOTES, Brief Assessment, Lab Results, Radiology Results, Assessment/Plan   Last Updated: 17-Dec-15 13:33 by Loistine Simas (MD)

## 2015-03-27 NOTE — Discharge Summary (Signed)
PATIENT NAME:  Alan Reed, Alan Reed MR#:  [id number] DATE OF BIRTH:  [birth year]  DATE OF ADMISSION:  10/05/2014 DATE OF DISCHARGE:    PRIMARY CARE PHYSICIAN: Dr. Rosario Jacks.   DISCHARGE DIAGNOSES: 1.  Severe sepsis due to urinary tract infection.  2.  Acute on chronic systolic congestive heart failure with ejection fraction 35% to 40%.  3.  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  4.  Acute urinary retention.  5.  Acute renal failure.  6.  Right renal mass.  7.  Right Necrotic toe with dry gangrene.  8.  Peripheral vascular disease.  9.  Deep vein thrombosis.   CONDITION: Stable.   CODE STATUS: DNR.   HOME MEDICATIONS: Please refer to the medication reconciliation list.   DIET: Low-sodium, low-fat, low-cholesterol, ADA diet.   ACTIVITY: As tolerated.   FOLLOWUP CARE: Follow with PCP, Dr. Grayland Ormond, Dr. Yves Dill within 1 to 2 weeks.   REASON FOR ADMISSION: Not eating and generalized weakness.   HOSPITAL COURSE: The patient is a 69 year old Caucasian male with a history of PVD and hypertension who came to the ED due to not eating and generalized weakness. The patient was found hypotensive and was treated with IV fluid. The patient was noted to have a WBC of 1.0, hemoglobin 4.2, platelets 64,000. Stool occult was negative. He also had an elevated BUN to 113, creatinine 2.5. For a detailed history and physical examination, please refer to the admission note dictated by Dr. Lunette Stands.  1.  Sepsis, possibly due to UTI. The patient was initially treated with vancomycin and cefepime. The patient's blood culture is negative. Vancomycin was discontinued according to Dr. Blane Ohara recommendation.  2.  Pancytopenia due to MDS. Since patient has a severe anemia, the patient got a total of 6 units of PRBC transfusion. Hemoglobin increased to 8.0. The patient's platelets are still low but stable. The last platelet today is 31,000. The patient also has neutropenia. WBC was 0.9. The patient got a bone  marrow biopsy which showed MDS. Dr. Ma Hillock suggested starting Neupogen yesterday. The patient's WBC increased to 3.6 today. According to Dr. Beverly Gust suggestion, the patient needs 2 more days of Neupogen in addition to PRBC transfusion p.r.n. as outpatient.  3.  Dr. Ola Spurr suggested to continue cefepime for 2 more days.  4.  Acute renal failure, resolved after IV fluid support.   5.  Acute on chronic systolic CHF with ejection fraction 35% to 40%. The patient developed shortness of breath. IV fluid was discontinued. The patient was started on Lasix. The patient's symptoms have much improved. We will continue Lasix and resume lisinopril. Since the patient's blood pressure is on the low side, we will hold Coreg.  6.  Acute urinary retention. The patient was put on Foley catheter, which was removed yesterday. The patient will continue Flomax.  7.  Right renal mass concerning renal cell carcinoma. According to oncology and urology consult, the patient can follow up as outpatient.  8.  Right necrotic toe with dry gangrene. The patient needs to follow up with a vascular surgeon and wound care as outpatient.  9.  Generally, the patient's symptoms have been improving. He is clinically stable. He will be discharged to a skilled nursing facility today. I discussed the patient's discharge plan with the patient, the patient's wife, nurse, social worker, and case Freight forwarder.   TIME SPENT: About 45 minutes.    ____________________________ Demetrios Loll, MD qc:at D: 10/13/2014 11:36:44 ET T: 10/13/2014 12:08:21 ET JOB#: [id number]  cc: Demetrios Loll,  MD, <Dictator> Demetrios Loll MD ELECTRONICALLY SIGNED 10/13/2014 15:51

## 2015-03-27 NOTE — Discharge Summary (Signed)
PATIENT NAME:  Alan Reed, Alan Reed MR#:  [id number] DATE OF BIRTH:  May 15, 1946  DATE OF ADMISSION:  11/16/2014 DATE OF DISCHARGE:  11/21/2014  DISCHARGE DIAGNOSES:   1.  Myelodysplastic syndrome with severe pancytopenia requiring transfusion support, hemoglobin down to 3.5 grams at admission.  2.  Poor oral intake dehydration and hypotension-improved with IV hydration and supportive treatment.   3.  Stool Hemoccult positive - seen by gastroenterology, no procedures are recommended at this time.   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 The patient also had progression of his anemia with hemoglobin severely down to 3.5 grams. He denied any obvious blood in the stools or urine. He was feeling very weak and lethargic. He had dyspnea at rest and also on exertion.   PAST MEDICAL HISTORY, SURGICAL HISTORY, FAMILY HISTORY, HOME MEDICATIONS AND ALLERGIES, EXAM FINDING:  Please refer to history and physical note for details.   HOSPITAL COURSE: Labs on admission showed WBC 900, ANC 400, hemoglobin 3.5, platelets 30,000. Given his acutely sick condition and hypotension, the patient was admitted to hospital and monitored in Intensive Care Unit where he was given packed red blood cell transfusion, IV fluids.  He was given Megace for appetite stimulation due to poor appetite. He was maintained on empiric antibiotic therapy due to neutropenia and also neutropenic precautions. He was given platelet transfusion if platelet count dropped to 10,000 or less. Stool Hemoccult was positive.  He was seen by GI Dr. Gustavo Lah, who did not feel that he was having significant GI bleeding and recommended no procedures at this time especially given his background of severe pancytopenia including  thrombocytopenia. The patient also opted for DNR status after discussion. By December 18 the patient's shortness of breath had improved and he did not have active complaints. WBC was 700, ANC 200, hemoglobin 7.9, and platelets 23. He did not have any recurrent chest pain which he initially had upon admission and had improved with sublingual nitroglycerin. Hospitalist had also followed patient for acute medical issues. The patient was discharged home on December 19 since he was clinically doing better with plans to follow up as an outpatient closely.   DISCHARGE MEDICATIONS: Atorvastatin 10 mg daily time is only, tamsulosin 0.4 mg once daily, aspirin 81 mg once daily. The patient advised to hold this metformin 500 mg once daily, trazodone 100 mg at bedtime, lisinopril 5 mg once daily, alprazolam 0.25 mg once daily, vitamin C 500 mg once daily, Lasix 20 mg once daily, docusate 200 mg b.i.d., Phenergan 25 mg q.6 hours p.r.n., carvedilol 3.125 mg b.i.d., MiraLax 17 grams once daily p.r.n. for constipation, and potassium chloride 20 mEq once daily.   DISCHARGE DIET: Regular.   ACTIVITY: As tolerated.   DISCHARGE FOLLOWUP:  The patient advised to keep close appointments at cancer center, same as already scheduled.    ____________________________ Rhett Bannister Ma Hillock, MD srp:at D: 11/29/2014 11:54:15 ET T: 11/29/2014 12:01:55 ET JOB#: [id number]  cc: Ramiz Turpin R. Ma Hillock, MD, <Dictator> Alveta Heimlich MD ELECTRONICALLY SIGNED 11/30/2014 10:08

## 2015-03-27 NOTE — Consult Note (Signed)
HEMATOLOGY follow-up -still weak, dyspnea same, on nasal cannula oxygen. Denies fever or chills.denies bleeding issues. No bone pains.weak-looking, on nasal cannula oxygen, otherwise alert and oriented and in no acute distress. Pallor present           vitals - afebrile, 101/65, 94% on 3L oxygen           lungs - bilateral diminished breath sounds overall           abdomen - soft, nontender           skin - minor bruising CBC today shows Hb 6.6, platelets 28, WBC 0.9, ANC 0.2, creatinine improved. 11/3 - serum B12, folate, direct Coombs test, LDH, haptoglobin, FDP, fibrinogen, HBsAg, HCV antibody, HIV antibody all unremarkable. SIEP polyclonal gammopathy. PT/INR 1.7, PTT 41.7, FDP<10 and fibrinogen 260.  10/05/14 - CT scan of abdomen/pelvis without contrast. IMPRESSION:  1. Moderate-sized bilateral pleural effusions with overlying atelectasis.  2. 4 cm partially calcified right upper pole renal mass worrisome for renal cell cancer. If the patient can have IV contrast that would be helpful for further evaluation. If not, an MRI with contrast (if possible) or without contrast is suggested.  3. No abdominal/pelvic lymphadenopathy.  4. Enlarged prostate gland and mild bladder distention.  5. Lower pole left renal calculus.  6. Advanced atherosclerotic calcifications involving the aorta and branch vessels.  69 year old gentleman with history of multiple medical problems admitted with progressive weakness and not eating, found to have severe pancytopenia of unclear etiology. No history of alcohol abuse, chronic liver disease. CT scan does show mild splenomegaly and kidney mass. He also presented with renal insufficiency which is improving with hydration. He remains afebrile at this time. Continue on current supportive treatment, along with neutropenic isolation and diet, supportive transfusion as indicated by labs and symptoms. Labs sent so far as described above does not reveal obvious etiology for pancytopenia,  including B12 and folate levels are normal. Bone marrow biopsy report pending. Dr.Finnegan will continue to follow patient in my absence.  Electronic Signatures: Jonn Shingles (MD)  (Signed on 05-Nov-15 23:10)  Authored  Last Updated: 05-Nov-15 23:10 by Jonn Shingles (MD)

## 2015-03-27 NOTE — Consult Note (Signed)
Chief Complaint:  Subjective/Chief Complaint Please see full GI consult and brief consult note.  Patient seen and examined, chart reviewed.  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  He is s/p recent chemotherapy.   He has been transfused.  Patietn denies andy evidence of gross GI blood loss to include hematochezia, hematemesis, coffee ground emesis, abdominal pain, or melena.  Hemoccult testing was hem positive.  Patietn does have external hemorrhoids, which in the current setting of severe thrombocytopenia, could be a related cuse of the result.    Currently very high risk for complications of luminal evaluation with marked leukopenia dn thrombocytopenia, both relative contraindications to proceedures.  Recommend transfusion of blood products per oncology recs.  Luminal evaluation once clinically improved.  Would treat hemorrhoids with external cream applications of anusol HC 2.5 % tid for 10 days.  Case discussed wtih Dr Ma Hillock.   VITAL SIGNS/ANCILLARY NOTES: **Vital Signs.:   16-Dec-15 13:09  Vital Signs Type Routine  Temperature Temperature (F) 97.9  Celsius 36.6  Temperature Source oral  Respirations Respirations 19  Systolic BP Systolic BP 98  Diastolic BP (mmHg) Diastolic BP (mmHg) 62  Mean BP 74  Pulse Ox % Pulse Ox % 96  Pulse Ox Activity Level  At rest   Brief Assessment:  Cardiac Irregular   Respiratory clear BS   Gastrointestinal details normal Soft  Nontender  Nondistended  No masses palpable  Bowel sounds normal   Lab Results: Routine BB:  15-Dec-15 09:04   Crossmatch Unit 1 Transfused  Crossmatch Unit 2 Transfused  Result(s) reported on 18 Nov 2014 at 08:23AM.    10:24   Platelets (Blood Component) Transfused  Result(s) reported on 18 Nov 2014 at 08:23AM.  Routine Chem:  14-Dec-15 13:56   Creatinine (comp)  1.47    15:56   BUN  70  Creatinine (comp)  1.42  15-Dec-15 04:39   BUN  66  Creatinine (comp)  1.32   16-Dec-15 01:58   Result Comment PLATELET - VERIFIED BY SMEAR ESTIMATE PLATELET/WBC - RESULTS VERIFIED BY REPEAT TESTING.  - NOTIFIED OF CRITICAL VALUE  - READ-BACK PROCESS PERFORMED.   - CALLED TO GINGER ORE AT 0310 ON  - 11/18/14 [place]  Result(s) reported on 18 Nov 2014 at 03:15AM.    02:00   Glucose, Serum  115  BUN  40  Creatinine (comp) 1.02  Sodium, Serum 142  Potassium, Serum 4.1  Chloride, Serum  111  CO2, Serum 22  Calcium (Total), Serum  7.3  Anion Gap 9  Osmolality (calc) 294  eGFR (African American) >60  eGFR (Non-African American) >60 (eGFR values <71m/min/1.73 m2 may be an indication of chronic kidney disease (CKD). Calculated eGFR, using the MRDR Study equation, is useful in  patients with stable renal function. The eGFR calculation will not be reliable in acutely ill patients when serum creatinine is changing rapidly. It is not useful in patients on dialysis. The eGFR calculation may not be applicable to patients at the low and high extremes of body sizes, pregnant women, and vegetarians.)  Result Comment CBC - DUPLICATE ORDER. PLEASE SEE ACCESSION  - [facility] [id number] [id number]  Result(s) reported on 18 Nov 2014 at 02:42AM.  Routine Sero:  16-Dec-15 11:30   Occult Blood, Feces POSITIVE (Result(s) reported on 18 Nov 2014 at 12:05PM.)  Routine Hem:  15-Dec-15 04:39   WBC (CBC)  0.7  Hemoglobin (CBC)  5.2  Platelet Count (CBC)  15  16-Dec-15  01:58   WBC (CBC)  0.8  RBC (CBC)  2.58  Hemoglobin (CBC)  7.6  Hematocrit (CBC)  22.3  Platelet Count (CBC)  26  MCV 87  MCH 29.5  MCHC 34.1  RDW  14.9  Neutrophil % 35.5  Lymphocyte % 63.0  Monocyte % 0.7  Eosinophil % 0.6  Basophil % 0.2  Neutrophil #  0.3  Lymphocyte #  0.5  Monocyte #  0.0  Eosinophil # 0.0  Basophil # 0.0    02:00   WBC (CBC) -  RBC (CBC) -  Hemoglobin (CBC) -  Hematocrit (CBC) -  Platelet Count (CBC) -  MCV -  MCH -  MCHC -  RDW -  Neutrophil % -  Lymphocyte % -   Monocyte % -  Eosinophil % -  Basophil % -  Neutrophil # -  Lymphocyte # -  Monocyte # -  Eosinophil # -  Basophil # -  Bands -  Segmented Neutrophils -  Lymphocytes -  Variant Lymphocytes -  Monocytes -  Eosinophil -  Basophil -  Metamyelocyte -  Myelocyte -  Promyelocyte -  Blast-Like -  Other Cells -  NRBC -  Diff Comment 1 -  Diff Comment 2 -  Diff Comment 3 -  Diff Comment 4 -  Diff Comment 5 -  Diff Comment 6 -  Diff Comment 7 -  Diff Comment 8 -  Diff Comment 9 -  Diff Comment 10 - (Result(s) reported on 18 Nov 2014 at 02:42AM.)   Assessment/Plan:  Assessment/Plan:  Assessment 1) profound anemia/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 hemodynamically stable, heme positive but no evidence of significant GI bleeding.   Plan 1) see recs above.  following.   Electronic Signatures: Loistine Simas (MD)  (Signed 16-Dec-15 19:08)  Authored: Chief Complaint, VITAL SIGNS/ANCILLARY NOTES, Brief Assessment, Lab Results, Assessment/Plan   Last Updated: 16-Dec-15 19:08 by Loistine Simas (MD)

## 2015-03-27 NOTE — Consult Note (Signed)
PATIENT NAME:  Alan Reed, Alan Reed MR#:  [id number] DATE OF BIRTH:  12/06/1945  DATE OF CONSULTATION:  10/09/2014  REFERRING PHYSICIAN:  Hillary Bow, MD CONSULTING PHYSICIAN:  Cheral Marker. Ola Spurr, MD  REASON FOR CONSULTATION:  Pancytopenia and necrotic toe ulcer.   HISTORY OF PRESENT ILLNESS: This is a pleasant 69 year old gentleman with history of diabetes as well as peripheral vascular disease status post left AKA in 2013 as well as hypertension and tobacco abuse. He was brought in by his wife with progressive weakness and inability to eat. He had been feeling ill for 1 to 2 weeks, but prior to that had been in his usual state of health. On admission, he was found to have severe pancytopenia with a white count less than 1, hemoglobin 4.2, and platelets 64,000. He also had acute renal failure with a BUN up to 113 and creatinine 2.5. He was admitted and has had a bone marrow biopsy done and has been treated with IV fluids for resuscitation as well as transfusions. CT scan shows a renal mass that is concerning for renal cell cancer. Bone marrow biopsy is pending. We are consulted for further management.   PAST MEDICAL HISTORY:  1.  Hypertension.  2.  Diabetes.  3.  Peripheral vascular disease status post amputation, left AKA.  4.  Renal insufficiency.  5.  Vertigo.   PAST SURGICAL HISTORY: AKA as above.   ALLERGIES: HEPARIN.   FAMILY HISTORY: Noncontributory.   SOCIAL HISTORY: Lives with his wife and 2 grandchildren. He smokes. He does not drink. No illicit drug use. He has no pets at home. No recent travel. No animal contact or tick bites. He says he is not an outdoor person.   REVIEW OF SYSTEMS: Eleven systems reviewed and negative except as per HPI.  PHYSICAL EXAMINATION: VITAL SIGNS: He has been afebrile with a current temperature of 98.3, pulse 90, blood pressure 103/61, respirations 27, and sat 94% on 3 liters.  GENERAL: He is quite ill-appearing, lying in bed curled up.  HEENT:  Pupils equal, round, and reactive to light and accommodation. Extraocular movements are intact. His sclerae are anicteric. Mucous membranes are quite dry. No oral lesions.  NECK: Supple.  HEART: Tachy but regular.  LUNGS: Decreased breath sounds in bilateral bases.  ABDOMEN: Soft, mildly distended, nontender to palpation.  EXTREMITIES: He has a left AKA. His right foot has no edema. He has a decent pulse. He does have necrotic-appearing right great toe, but this is dry gangrene. NEUROLOGIC: He is alert and oriented x3. Grossly nonfocal neuro exam.   DIAGNOSTIC DATA: Renal function currently 26/0.92. On admission his BUN was 113 AND creatinine was 2.27. LFTs show an albumin low at 2.1, T bili 1.2, alk phos, AST and ALT are normal, but on admission his ALT was 100 and alk phos was 121. Troponins were positive. White blood count currently is 1, hemoglobin 7.1, and platelets 28,000. On admission, white count 1, hemoglobin 4.2, platelets 64,000. He is now status post at least 4 units of blood. Blood cultures x2 are negative. Urine culture November 3rd: Mixed results. White count in the urine was only 16. B12 and other tests are pending. Hepatitis C was negative. HIV negative.   Imaging: CT of the abdomen and pelvis November 2nd shows moderate size bilateral pleural effusions with overlying atelectasis. There is a 4 cm partially calcified right upper pole renal mass worrisome for renal cancer. There is no lymphadenopathy. There is an enlarged prostate and mild bladder distention.  There is a left lower pole renal calculus. There is advanced arterial sclerosis.  Chest x-ray, November 4th showed significant increase in degree of pulmonary vascular congestion and edema.   Abdominal x-ray showed evidence of splenomegaly, November 4th.   IMPRESSION: A 69 year old quite ill gentleman admitted with a week or two of illness characterized by weakness and loss of appetite and found to have pancytopenia, increased  liver function tests, and acute renal failure. He has what appears to be a renal mass that could be consistent with renal cancer. He has been transfused and receiving supportive care. Bone marrow biopsy is pending. Cultures are negative and his has been afebrile.   I suspect he likely has a bone marrow process like leukemia, although hemophagocytic syndrome is also a possibility.   RECOMMENDATIONS: 1.  Continue cefepime.  2.  Agree with discontinuing vancomycin unless he is febrile.  3.  Add doxycycline on the very distant chance this is a tickborne illness such as Ehrlichia.   Thank you for the consult. I will be glad to follow with you.    ____________________________ Cheral Marker. Ola Spurr, MD dpf:sb D: 10/09/2014 14:07:08 ET T: 10/09/2014 14:57:32 ET JOB#: [id number]  cc: Cheral Marker. Ola Spurr, MD, <Dictator> Hadiyah Maricle Ola Spurr MD ELECTRONICALLY SIGNED 10/13/2014 17:37

## 2015-03-27 NOTE — Consult Note (Signed)
Brief Consult Note: Diagnosis: R renal mass. Acute urinary retention.   Patient was seen by consultant.   Consult note dictated.   Recommend further assessment or treatment.   Orders entered.   Discussed with Attending MD.   Comments: Remove Foley when ambulatory for voiding trial. Followup in the office after discharge for further evaluation of renal mass.  Electronic Signatures: Royston Cowper (MD)  (Signed [phone] 12:24)  Authored: Brief Consult Note   Last Updated: 03-Nov-15 12:24 by Royston Cowper (MD)

## 2015-03-27 NOTE — Consult Note (Signed)
PATIENT NAME:  Alan Reed, Alan Reed MR#:  [id number] DATE OF BIRTH:  February 11, 1946  DATE OF CONSULTATION:  11/17/2014  REFERRING PHYSICIAN:   CONSULTING PHYSICIAN:  Theodore Demark, NP  REASON FOR ADMISSION:  The patient admitted by Dr. Ma Hillock for progressive anemia.   REASON FOR CONSULTATION: GI consult ordered by Dr. Ether Griffins to evaluate severe anemia with Hemoccult positive stool.   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 Lucky Cowboy, CRI, past MRSA, recent sepsis on 11/15 related to UTI, CHF, right renal mass found November of this year, admitted with severe anemia who has received 6 units of packed red blood cells with improvement to hemoglobin of now 7.6 for evaluation of anemia with Hemoccult positive stool result today. There is no history of luminal evaluation. Denies abdominal pain, dyspepsia, NVD, melena/hematochezia, problems swallowing, and all other GI-related complaints. Denies nonsteroidal anti-inflammatory drugs. Continues with severe leukopenia, 0.7, on neutropenic precautions, and severe thrombocytopenia (26).  He is hemodynamically stable. Did have some ARF on admission, that has improved.  Stayed briefly in the CCU for shortness of breath, tachycardia, now he is on the regular medical floor as his condition improved. He is followed by Dr. Ma Hillock in hematology clinic for cancer and hematology care. Has a history of severe pancytopenia.   PAST MEDICAL HISTORY: As in HPI above.   FAMILY HISTORY: Significant for hypertension, heart disease. There is no family history of colorectal cancer, colon polyps, liver disease, ulcers.   SOCIAL HISTORY: History of chronic smoking. No alcohol or illicits. Lives at home with his wife.   MEDICATIONS:  Xanax 0.25 mg p.o. at bedtime, ASA 81 mg p.o.  daily, atorvastatin 10 mg p.o. daily, ciprofloxacin 250 mg p.o. q. 12 h., docusate sodium 200 mg p.o. b.i.d., Lasix 20 mg p.o. daily, lisinopril 5 mg p.o. daily, magnesium 400 mg p.o. daily, metformin 500 mg p.o. daily, Phenergan 25 mg p.o. q. 6 h. p.r.n., tamsulosin 0.4 mg p.o. daily, trazodone 100 mg p.o. at bedtime, vitamin C once daily. The wife did think he was possibly on a blood thinner, however in review with her of this she cannot identify any of these as something he is regularly taking.   ALLERGIES: HEPARIN.   REVIEW OF SYSTEMS:  10 systems reviewed, significant for weakness, fatigue, poor appetite, easy bruising, shortness of breath at rest, dyspnea on exertion. Otherwise unremarkable other than what is noted above.   LABORATORY DATA: Most recent laboratories, glucose 115, BUN 40, creatinine 1.02, sodium 142, potassium 4.1, GFR greater than 60, total protein 4.6, albumin 1.9, total bilirubin 0.3, ALP 62, AST 9, ALT 11, CK 21, MB fraction 3.0. WBC 0.8, hemoglobin 7.6, hematocrit 22.3, platelet count 26,000.   Nursing note indicates he had a dark green soft bowel movement today.   PHYSICAL EXAMINATION:  VITAL SIGNS: Most recent vital signs. temperature 97.9, pulse 103, respiratory rate 19, blood pressure 98/62, SaO2 96% on room air.  GENERAL:  Resting comfortably in bed in no acute distress.  HEENT: Normocephalic, atraumatic. Conjunctivae somewhat pink. Sclerae are clear. Mucous membranes pink and moist.  NECK: No lymphadenopathy or thyromegaly.  CARDIAC: S1, S2, RRR. No MRG.  CHEST: Respirations somewhat shallow, somewhat tachypneic. There is no increased work of breathing. His lung sounds are clear bilaterally with diminished bases. He is able to speak well in complete sentences.  ABDOMEN: Bowel sounds x 4. Soft, nontender, nondistended. No guarding, no rigidity, peritoneal signs, hepatosplenomegaly, or other abnormalities.  RECTAL:  Modified rectal exam, large protruding external  hemorrhoids. Digital exam deferred due to neutropenia and thrombocytopenia.  SKIN: Warm, dry, pale, pink. Minimal bruising. No rash.  EXTREMITIES: Moves arms well. Strength 5 out of 5. Moves right lower extremity well. Status post left AKA.   NEUROLOGICAL: Alert, oriented x 3. Cranial nerves II through XII intact. Speech clear. No facial droop.  PSYCHIATRIC: Pleasant, calm, cooperative.   IMPRESSION AND PLAN: Anemia likely multifactorial. He is on chemotherapy. He has myelodysplastic syndrome and chronic renal insufficiency. His Hemoccult positive result is nonspecific, it may be related to his large external hemorrhoids that may bleed more easily now due to his significant thrombocytopenia. Recommend treating these for now. Would also insure he is on a daily PPI to protect for stress ulceration. May benefit from luminal evaluation once his blood count and clinical condition stabilize and improve. Thank you very much for this consult.   These services were provided by Stephens November, MSN, [facility], in collaboration with Loistine Simas, MD with whom have I discussed this patient in full. We also agree with present hematology care and the following of his blood counts and the transfusing p.r.n.    ____________________________ Theodore Demark, NP chl:bu D: 11/18/2014 17:21:00 ET T: 11/18/2014 17:55:03 ET JOB#: [id number]  cc: Theodore Demark, NP, <Dictator> [place] SIGNED 11/20/2014 17:19

## 2015-03-27 NOTE — Consult Note (Signed)
Brief Consult Note: Diagnosis: unstable angina.   Patient was seen by consultant.   Consult note dictated.   Recommend further assessment or treatment.   Orders entered.   Discussed with Attending MD.   Comments: * unstable angina: likely due to supply demand ischemia due to severe anemia, prn nitro sl for now  * severe symptomatic anemia: Hemoglobin 3.5 on admission and 3 PRBC ordered, just finished 1  * sinus tachycardia: due to severe anemia.  * High risk myelodysplastic syndrome with severe pancytopenia: getting chemo - f/by dr Ma Hillock, per family it's not responding.  DNR/NMP  family is leaning towards comfort care only. discussed with dr phifer who will see him if possible this evening.  Electronic Signatures: Remer Macho (MD)  (Signed 14-Dec-15 17:10)  Authored: Brief Consult Note   Last Updated: 14-Dec-15 17:10 by Remer Macho (MD)

## 2015-03-27 NOTE — Consult Note (Signed)
HEMATOLOGY follow-up note -still feels weak, mild dyspnea, on nasal cannula oxygen. Denies fever or chills.denies bleeding issues. No bone pains.weak-looking, on nasal cannula oxygen, otherwise alert and oriented and in no acute distress. Pallor present           vitals - 97.5, 81, 27, 100/51, 96% on 2 L oxygen           lungs - bilateral diminished breath sounds overall           abdomen - soft, nontender           skin - minor bruising CBC today shows Hb 7, platelets 31, WBC 0.9, 35% neutrophils, creatinine better. 11/3 - serum B12, folate, direct Coombs test, LDH, haptoglobin, FDP, fibrinogen, HBsAg, HCV antibody, HIV antibody all unremarkable. PT/INR 1.7, PTT 41.7, FDP<10 and fibrinogen 260.  10/05/14 - CT scan of abdomen/pelvis without contrast. IMPRESSION:  1. Moderate-sized bilateral pleural effusions with overlying atelectasis.  2. 4 cm partially calcified right upper pole renal mass worrisome for renal cell cancer. If the patient can have IV contrast that would be helpful for further evaluation. If not, an MRI with contrast (if possible) or without contrast is suggested.  3. No abdominal/pelvic lymphadenopathy.  4. Enlarged prostate gland and mild bladder distention.  5. Lower pole left renal calculus.  6. Advanced atherosclerotic calcifications involving the aorta and branch vessels.  69 year old gentleman with history of multiple medical problems admitted with progressive weakness and not eating, found to have severe pancytopenia of unclear etiology. No history of alcohol abuse, chronic liver disease. CT scan does show mild splenomegaly and kidney mass. He also presented with renal insufficiency which is improving with hydration. He remains afebrile at this time. Continue on current supportive treatment, along with neutropenic isolation and diet. Labs sent so far as described above does not reveal obvious etiology for pancytopenia, including B-12 and folate levels are normal. Will therefore  proceed with bone marrow biopsy evaluation today to look for marrow disorder like myelodysplasia versus other. Will continue to follow.   Electronic Signatures: Jonn Shingles (MD)  (Signed on 04-Nov-15 09:41)  Authored  Last Updated: 04-Nov-15 09:41 by Jonn Shingles (MD)

## 2016-08-23 IMAGING — CT CT ABD-PELV W/O CM
2 of 4 series · 15 of 46 positions shown, 17 images · non-contrast
Comparison: None.

CLINICAL DATA: Vomiting and abdominal pain since yesterday.

EXAM:
CT ABDOMEN AND PELVIS WITHOUT CONTRAST
TECHNIQUE: Multidetector CT imaging of the abdomen and pelvis was performed
following the standard protocol without IV contrast.

[Series 2: routine abd pel without · axial · non-contrast · 0.75mm/px · z∈[-462,-2]mm · 12 of 102 slices shown, 14 images]
[im 5/102  soft-tissue]
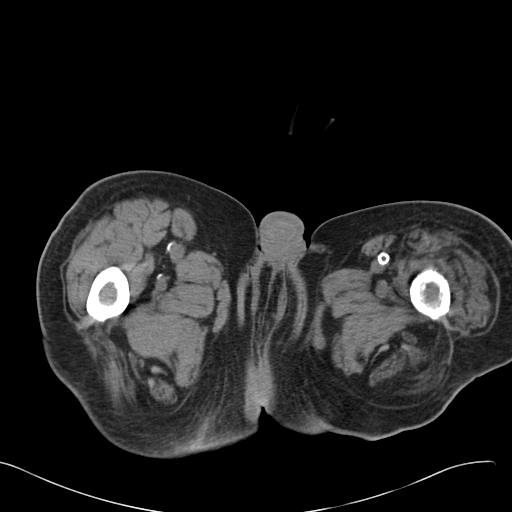
[im 5/102  bone]
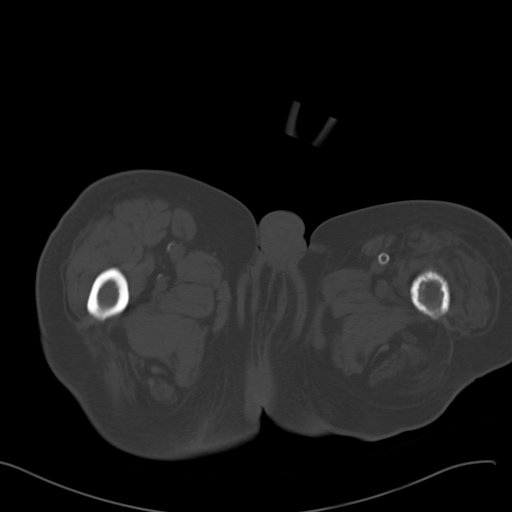
[im 13/102  soft-tissue]
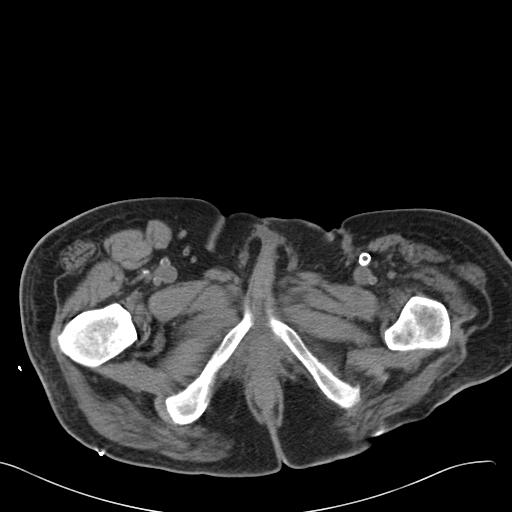
[im 22/102  soft-tissue]
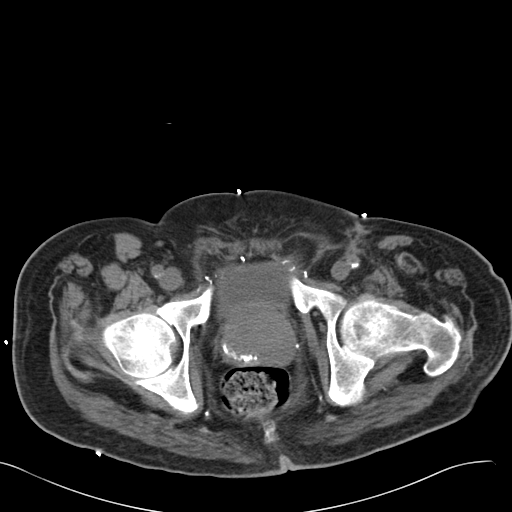
[im 30/102  soft-tissue]
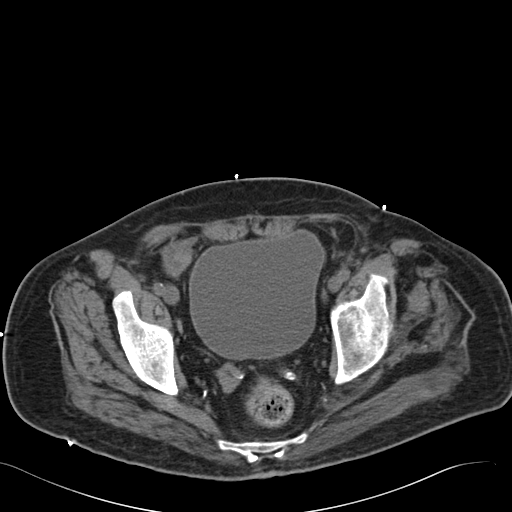
[im 38/102  soft-tissue]
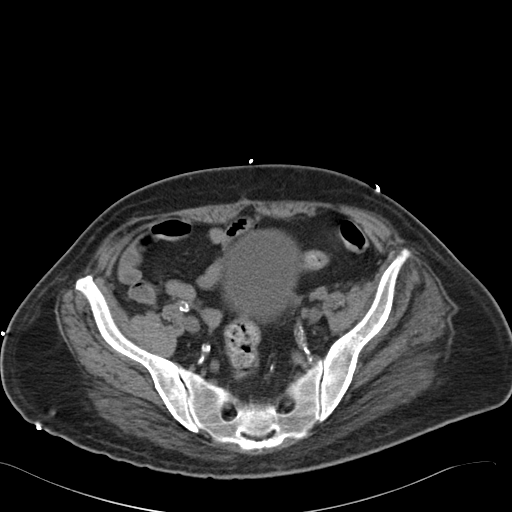
[im 47/102  soft-tissue]
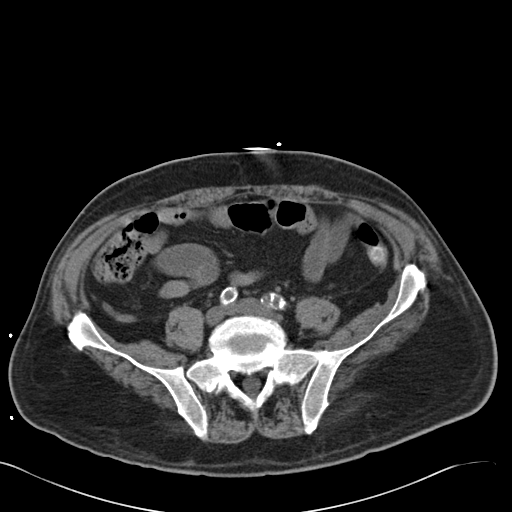
[im 55/102  soft-tissue]
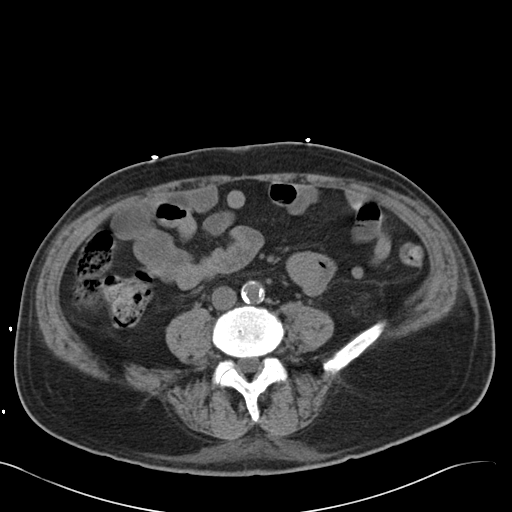
[im 64/102  soft-tissue]
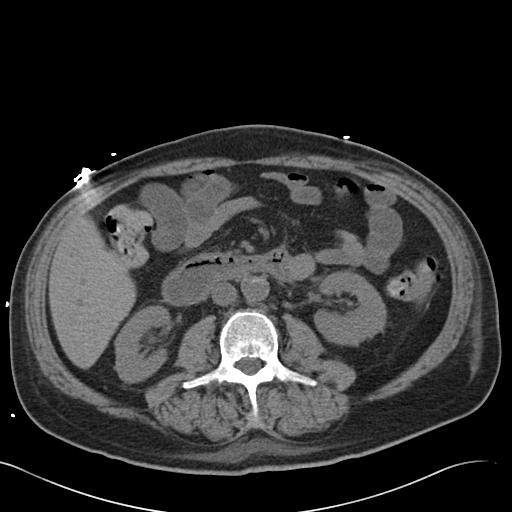
[im 72/102  soft-tissue]
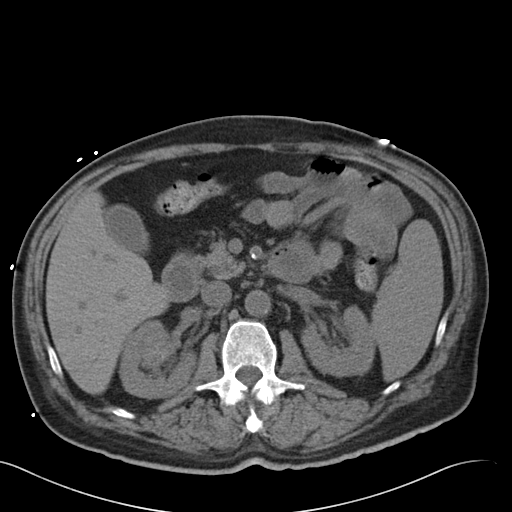
[im 72/102  bone]
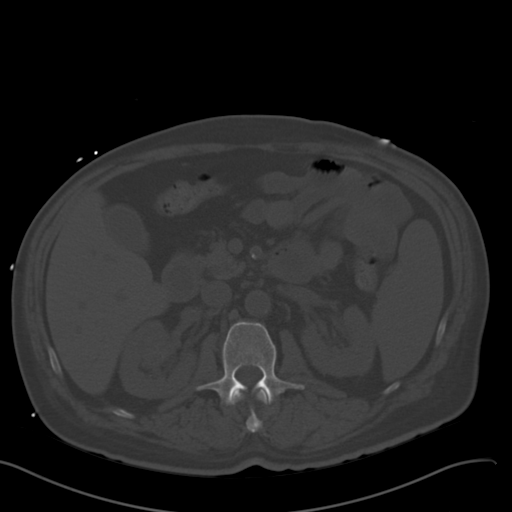
[im 80/102  soft-tissue]
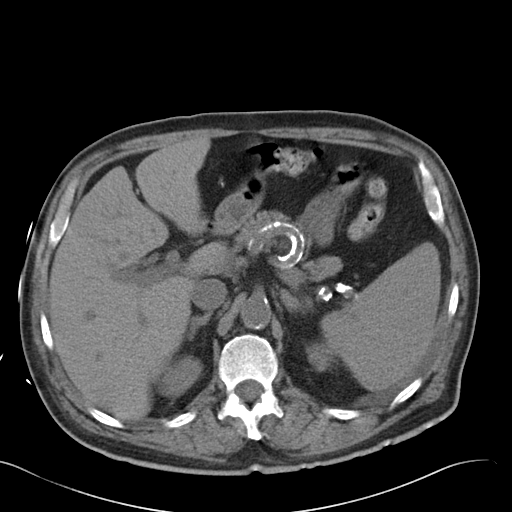
[im 89/102  soft-tissue]
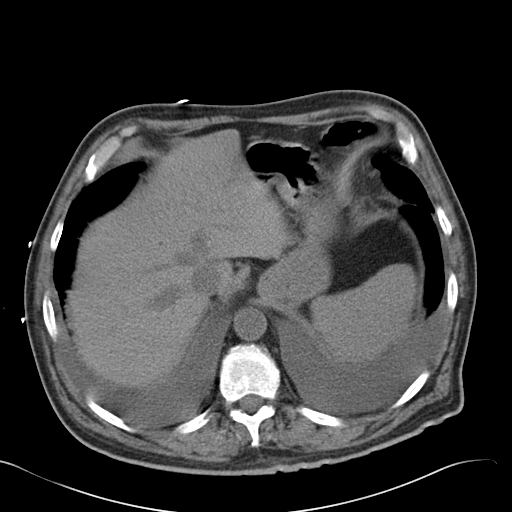
[im 97/102  soft-tissue]
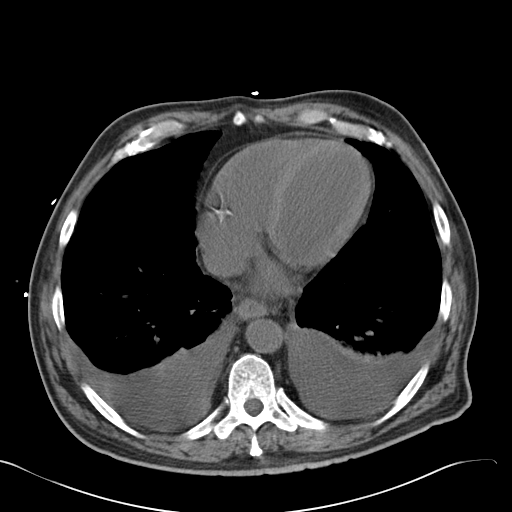

[Series 5: cor routine abd pel wo · coronal · 0.72mm/px · 3 of 134 slices shown]
[im 45/134  soft-tissue]
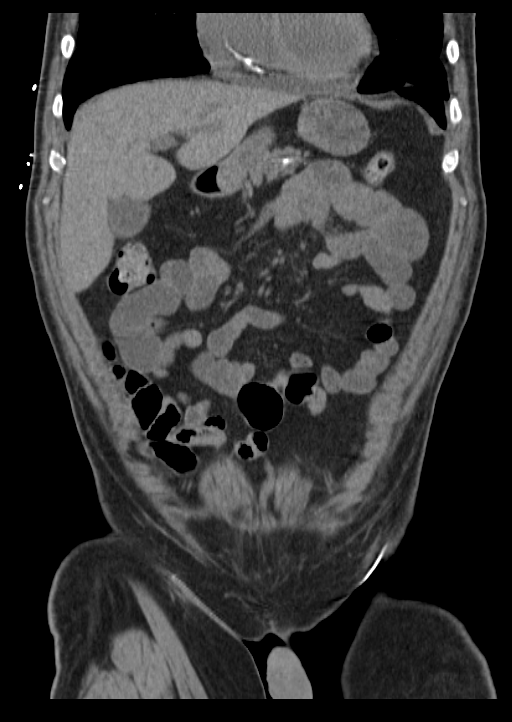
[im 60/134  soft-tissue]
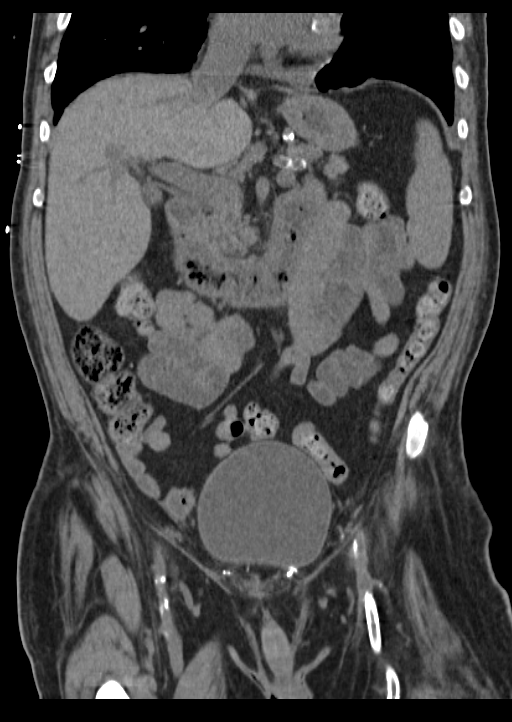
[im 74/134  soft-tissue]
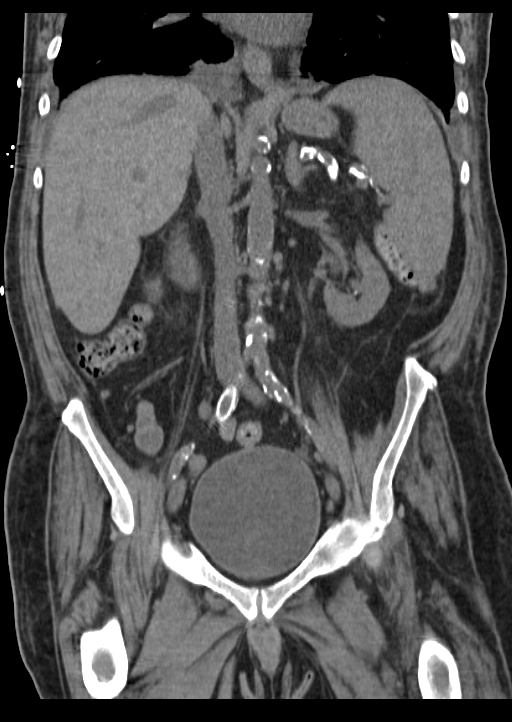

[15 of 46 positions shown; findings below may reference images not displayed]

FINDINGS: Lower chest: There are moderate size bilateral pleural effusions
with overlying atelectasis. The heart is normal in size. No
pericardial effusion. Three-vessel coronary artery calcifications
are noted. The esophagus is grossly normal.

Hepatobiliary: No focal hepatic lesions or intrahepatic biliary
dilatation. Gallbladder it is normal. No common bile duct
dilatation.

Pancreas: Grossly normal without contrast.

Spleen: Splenomegaly.  14.5 x 12.3 x 9.5 cm No focal lesions.

Adrenals/Urinary Tract: Complex 4 cm right upper pole renal mass
worrisome for renal cell carcinoma. A left renal calculus is noted
in the lower pole Both adrenal glands are unremarkable.

Stomach/Bowel: The stomach, duodenum, small bowel and colon are
grossly normal without oral contrast. No inflammatory changes, mass
lesions or obstructive findings. The terminal ileum is normal. The
appendix is normal.

Vascular/Lymphatic: No mesenteric or retroperitoneal mass are
adenopathy. The aorta demonstrates moderate atherosclerotic
calcifications. Similar findings involving the branch vessels. The
right iliac artery stents is noted.

Pelvis: The prostate gland is enlarged. The bladder is mildly
distended. No bladder mass or bladder calculi. No pelvic mass or
adenopathy. No free pelvic fluid collections. Surgical changes noted
from bilateral inguinal hernia repairs. No inguinal mass or
adenopathy. A right common femoral bypass graft is noted.

Musculoskeletal: The bony structures are unremarkable.
IMPRESSION: 1. Moderate-sized bilateral pleural effusions with overlying
atelectasis.
2. 4 cm partially calcified right upper pole renal mass worrisome
for renal cell cancer. If the patient can have IV contrast that
would be helpful for further evaluation. If not, an MRI with
contrast (if possible) or without contrast is suggested.
3. No abdominal/pelvic lymphadenopathy.
4. Enlarged prostate gland and mild bladder distention.
5. Lower pole left renal calculus.
6. Advanced atherosclerotic calcifications involving the aorta and
branch vessels.

## 2016-08-23 IMAGING — CR DG CHEST 1V PORT
1 series · 1 of 1 positions shown · non-contrast
Comparison: 02/25/2013

CLINICAL DATA: Right foot gangrene, vomiting, renal insufficiency

EXAM:
PORTABLE CHEST - 1 VIEW

[ap]
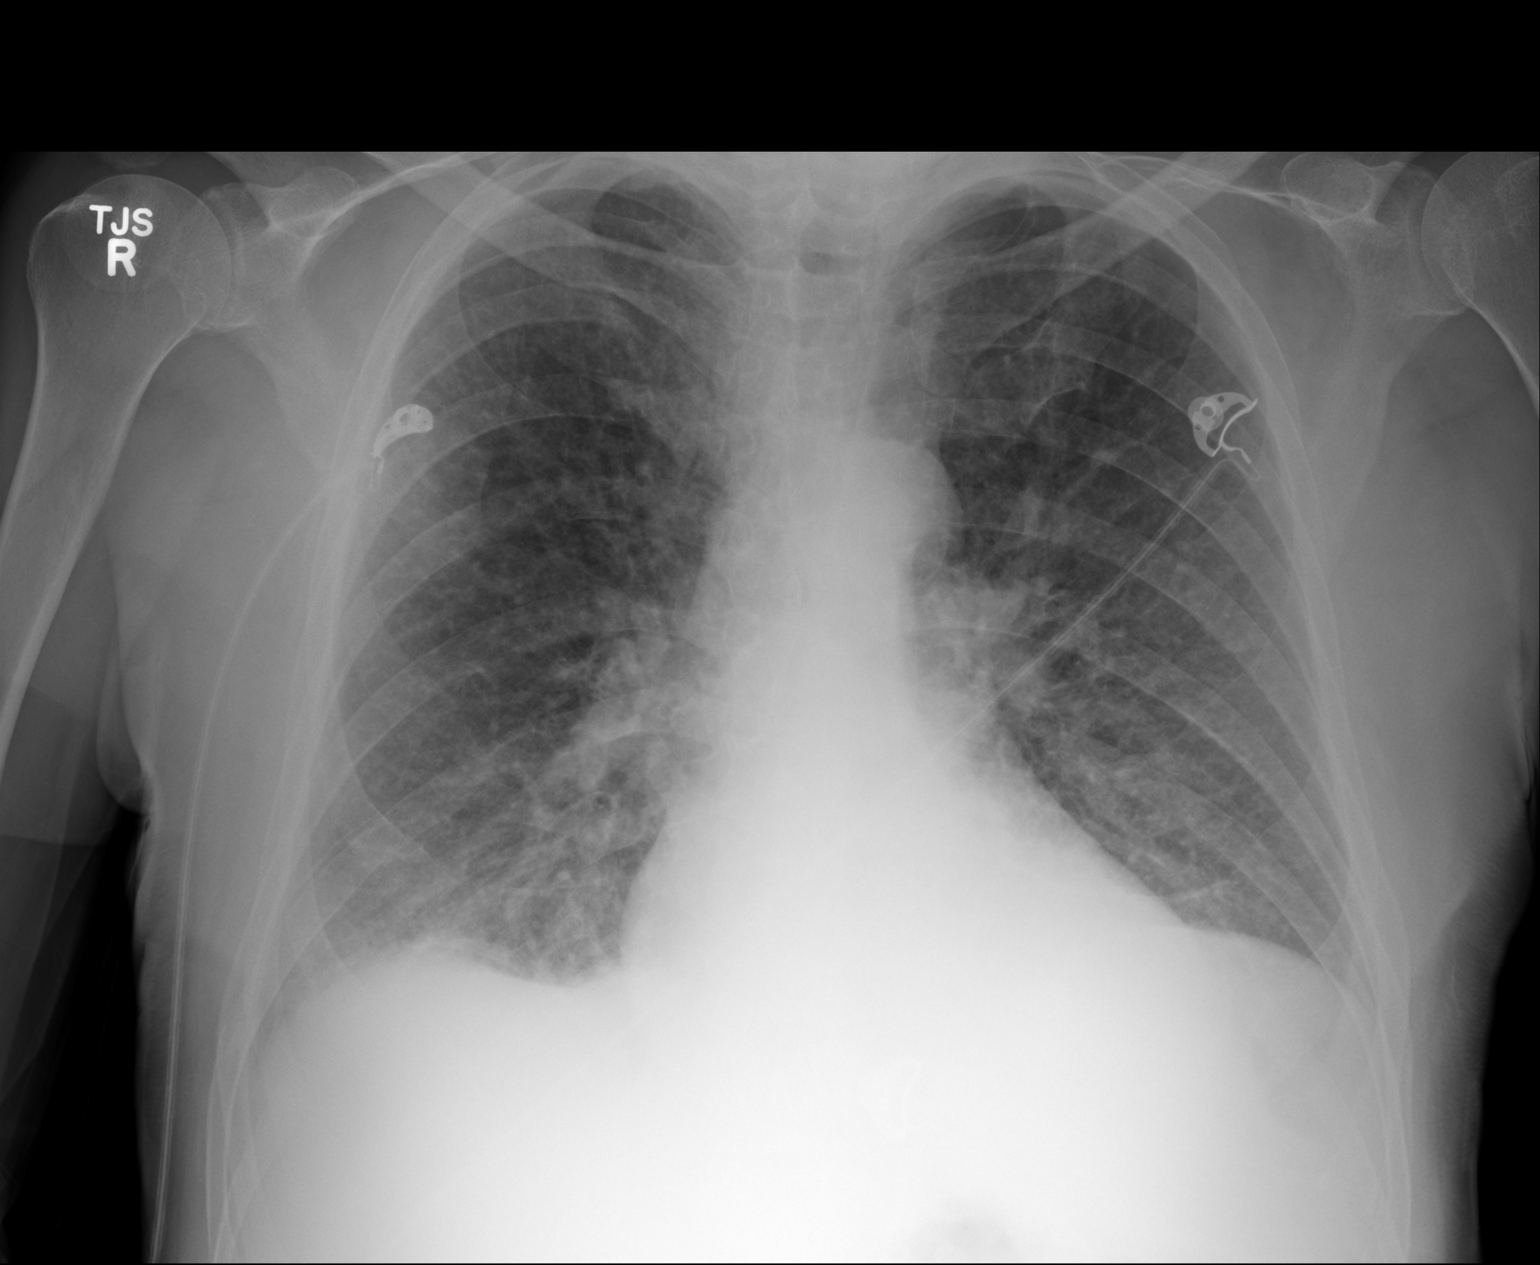

[1 of 1 positions shown; findings below may reference images not displayed]

FINDINGS: Cardiomegaly with mild interstitial edema.

No pleural effusion or pneumothorax.
IMPRESSION: Cardiomegaly with mild interstitial edema.
